# Patient Record
Sex: Male | Born: 1985 | Race: White | Hispanic: No | Marital: Married | Smoking: Current every day smoker
Health system: Southern US, Community
[De-identification: ages and names within clinical notes are randomized; demographics above are authoritative.]

## PROBLEM LIST (undated history)

## (undated) DIAGNOSIS — J40 Bronchitis, not specified as acute or chronic: Secondary | ICD-10-CM

---
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
## 2005-03-09 ENCOUNTER — Emergency Department: Payer: Self-pay | Admitting: Emergency Medicine

## 2005-08-18 ENCOUNTER — Emergency Department: Payer: Self-pay | Admitting: Emergency Medicine

## 2006-09-30 ENCOUNTER — Emergency Department: Payer: Self-pay | Admitting: Emergency Medicine

## 2006-11-27 ENCOUNTER — Emergency Department: Payer: Self-pay | Admitting: Emergency Medicine

## 2007-01-26 ENCOUNTER — Emergency Department: Payer: Self-pay | Admitting: Unknown Physician Specialty

## 2007-04-23 ENCOUNTER — Emergency Department: Payer: Self-pay | Admitting: Emergency Medicine

## 2009-07-16 ENCOUNTER — Emergency Department: Payer: Self-pay | Admitting: Emergency Medicine

## 2009-08-06 ENCOUNTER — Emergency Department: Payer: Self-pay | Admitting: Emergency Medicine

## 2009-08-07 ENCOUNTER — Emergency Department: Payer: Self-pay | Admitting: Emergency Medicine

## 2013-05-11 ENCOUNTER — Emergency Department: Payer: Self-pay | Admitting: Emergency Medicine

## 2014-01-09 ENCOUNTER — Emergency Department: Payer: Self-pay | Admitting: Emergency Medicine

## 2014-01-09 LAB — BASIC METABOLIC PANEL
Anion Gap: 8 (ref 7–16)
BUN: 6 mg/dL — AB (ref 7–18)
CALCIUM: 9 mg/dL (ref 8.5–10.1)
CREATININE: 1.07 mg/dL (ref 0.60–1.30)
Chloride: 105 mmol/L (ref 98–107)
Co2: 30 mmol/L (ref 21–32)
EGFR (African American): >60
EGFR (Non-African Amer.): >60
GLUCOSE: 120 mg/dL — AB (ref 65–99)
Osmolality: 284 (ref 275–301)
Potassium: 3.7 mmol/L (ref 3.5–5.1)
Sodium: 143 mmol/L (ref 136–145)

## 2014-01-09 LAB — CBC
HCT: 46.4 % (ref 40.0–52.0)
HGB: 16.4 g/dL (ref 13.0–18.0)
MCH: 32.3 pg (ref 26.0–34.0)
MCHC: 35.4 g/dL (ref 32.0–36.0)
MCV: 91 fL (ref 80–100)
Platelet: 216 10*3/uL (ref 150–440)
RBC: 5.09 10*6/uL (ref 4.40–5.90)
RDW: 12.8 % (ref 11.5–14.5)
WBC: 13.4 10*3/uL — AB (ref 3.8–10.6)

## 2014-01-09 LAB — TROPONIN I

## 2014-05-23 ENCOUNTER — Emergency Department
Admission: EM | Admit: 2014-05-23 | Discharge: 2014-05-24 | Disposition: A | Discharge status: Home / Self Care | Payer: Self-pay | Attending: Emergency Medicine | Admitting: Emergency Medicine

## 2014-05-23 ENCOUNTER — Encounter: Payer: Self-pay | Admitting: Emergency Medicine

## 2014-05-23 ENCOUNTER — Other Ambulatory Visit: Payer: Self-pay

## 2014-05-23 ENCOUNTER — Emergency Department: Payer: Self-pay

## 2014-05-23 DIAGNOSIS — Z720 Tobacco use: Secondary | ICD-10-CM | POA: Insufficient documentation

## 2014-05-23 DIAGNOSIS — R61 Generalized hyperhidrosis: Secondary | ICD-10-CM | POA: Insufficient documentation

## 2014-05-23 DIAGNOSIS — J209 Acute bronchitis, unspecified: Secondary | ICD-10-CM | POA: Insufficient documentation

## 2014-05-23 LAB — CBC
HCT: 43 % (ref 40.0–52.0)
HEMOGLOBIN: 14.8 g/dL (ref 13.0–18.0)
MCH: 31.1 pg (ref 26.0–34.0)
MCHC: 34.4 g/dL (ref 32.0–36.0)
MCV: 90.3 fL (ref 80.0–100.0)
Platelets: 195 10*3/uL (ref 150–440)
RBC: 4.76 MIL/uL (ref 4.40–5.90)
RDW: 13 % (ref 11.5–14.5)
WBC: 11.5 10*3/uL — ABNORMAL HIGH (ref 3.8–10.6)

## 2014-05-23 LAB — BASIC METABOLIC PANEL
Anion gap: 8 (ref 5–15)
BUN: 12 mg/dL (ref 6–20)
CALCIUM: 9.4 mg/dL (ref 8.9–10.3)
CHLORIDE: 104 mmol/L (ref 101–111)
CO2: 28 mmol/L (ref 22–32)
CREATININE: 0.99 mg/dL (ref 0.61–1.24)
GFR calc non Af Amer: >60 mL/min (ref >60)
Glucose, Bld: 96 mg/dL (ref 65–99)
Potassium: 4.1 mmol/L (ref 3.5–5.1)
SODIUM: 140 mmol/L (ref 135–145)

## 2014-05-23 LAB — FIBRIN DERIVATIVES D-DIMER (ARMC ONLY): Fibrin derivatives D-dimer (ARMC): 224 (ref 0–499)

## 2014-05-23 LAB — TROPONIN I: Troponin I: <0.03 ng/mL (ref <0.031)

## 2014-05-23 MED ORDER — IPRATROPIUM-ALBUTEROL 0.5-2.5 (3) MG/3ML IN SOLN: 3.0000 mL | Freq: Once | RESPIRATORY_TRACT | Status: DC

## 2014-05-23 MED ORDER — IPRATROPIUM-ALBUTEROL 0.5-2.5 (3) MG/3ML IN SOLN
RESPIRATORY_TRACT | Status: AC
  Filled 2014-05-23: qty 3

## 2014-05-23 NOTE — ED Notes (Signed)
Pt c/o cough for 3 days; hemoptysis; shortness of breath; generalized weakness; denies fever; hoarse voice which pt says is normal for him

## 2014-05-23 NOTE — ED Provider Notes (Signed)
[facility] Emergency Department Provider Note  ____________________________________________  Time seen: 9:45 PM  I have reviewed the triage vital signs and the nursing notes.   HISTORY  Chief Complaint Cough, hemoptysis      HPI Dillon White is a 29 y.o. male who presents with cough for 1 week that is moderate. He reports that he had an episode of hemoptysis yesterday and twice today. He notes a small amount of blood. No fevers chills. No recent travel no history of blood clots. No recent incarceration no exposure to TB. He does smoke. Nothing seems to improve his cough.     History reviewed. No pertinent past medical history.  There are no active problems to display for this patient.   History reviewed. No pertinent past surgical history.  No current outpatient prescriptions on file.  Allergies Review of patient's allergies indicates not on file.  History reviewed. No pertinent family history.  Social History History  Substance Use Topics  . Smoking status: Current Every Day Smoker -- 0.50 packs/day    Types: Cigarettes  . Smokeless tobacco: Not on file  . Alcohol Use: No    Review of Systems  Constitutional: Negative for fever. Eyes: Negative for visual changes. ENT: Negative for sore throat. Cardiovascular: Negative for chest pain. Respiratory: Positive for cough Gastrointestinal: Negative for abdominal pain, vomiting and diarrhea. Genitourinary: Negative for dysuria. Musculoskeletal: Negative for back pain. Skin: Negative for rash. Neurological: Negative for headaches, focal weakness or numbness. Psychiatric: No anxiety  10-point ROS otherwise negative.  ____________________________________________   PHYSICAL EXAM:  VITAL SIGNS: ED Triage Vitals  Enc Vitals Group     BP 05/23/14 2115 126/85 mmHg     Pulse Rate 05/23/14 2115 109     Resp 05/23/14 2115 20     Temp 05/23/14 2115 98.7 F (37.1 C)     Temp Source  05/23/14 2115 Oral     SpO2 05/23/14 2115 97 %     Weight 05/23/14 2115 225 lb (102.059 kg)     Height 05/23/14 2115 5\' 4"  (1.626 m)     Head Cir --      Peak Flow --      Pain Score 05/23/14 2116 6     Pain Loc --      Pain Edu? --      Excl. in GC? --      Constitutional: Alert and oriented. Well appearing and in no distress. Eyes: Conjunctivae are normal. PERRL. Normal extraocular movements. ENT   Head: Normocephalic and atraumatic.   Nose: No congestion/rhinnorhea.   Mouth/Throat: Mucous membranes are moist.   Neck: No stridor. Hematological/Lymphatic/Immunilogical: No cervical lymphadenopathy. Cardiovascular: Normal rate, regular rhythm. Normal and symmetric distal pulses are present in all extremities. No murmurs, rubs, or gallops. Respiratory: Normal respiratory effort without tachypnea nor retractions. Breath sounds are clear and equal bilaterally. No wheezes/rales/rhonchi. Gastrointestinal: Soft and nontender. No distention. There is no CVA tenderness. Genitourinary: deferred Musculoskeletal: Nontender with normal range of motion in all extremities. No joint effusions.  No lower extremity tenderness nor edema. Neurologic:  Normal speech and language. No gross focal neurologic deficits are appreciated. Speech is normal.  Skin:  Skin is warm, dry and intact. No rash noted. Psychiatric: Mood and affect are normal. Speech and behavior are normal. Patient exhibits appropriate insight and judgment.  ____________________________________________    LABS (pertinent positives/negatives)  Pending  ____________________________________________   EKG   Date: 05/23/2014  Rate: 102  Rhythm: Sinus tachycardia  QRS Axis: normal  Intervals: normal  ST/T Wave abnormalities: normal  Conduction Disutrbances: none  Narrative Interpretation: unremarkable      ____________________________________________    RADIOLOGY  Normal chest  x-ray  ____________________________________________   PROCEDURES  Procedure(s) performed: None  Critical Care performed: None    ____________________________________________   INITIAL IMPRESSION / ASSESSMENT AND PLAN / ED COURSE  Pertinent labs & imaging results that were available during my care of the patient were reviewed by me and considered in my medical decision making (see chart for details).  Patient well-appearing suspect bronchitis with small amount of hemoptysis however will send d-dimer to evaluate for potential blood clot.   ----------------------------------------- 11:11 PM on 05/23/2014 -----------------------------------------  Signed out care to Dr. Zenda AlpersWEBSTER she will follow-up on d-dimer ____________________________________________   FINAL CLINICAL IMPRESSION(S) / ED DIAGNOSES  Final diagnoses:  Acute bronchitis      Jene Everyobert [name], MD 05/23/14 2312

## 2014-05-23 NOTE — ED Notes (Signed)
Pt resting with no distress. This RN in room to give pt ordered nebulizer and pt refusing to ake the medication until the doctor "tells me what is going on with me".

## 2014-05-24 LAB — POCT RAPID STREP A: STREPTOCOCCUS, GROUP A SCREEN (DIRECT): NEGATIVE

## 2014-05-24 MED ORDER — AZITHROMYCIN 250 MG PO TABS: ORAL_TABLET | ORAL | Status: AC

## 2014-05-24 NOTE — ED Notes (Signed)
Throat culture obtained, neg result, and sent to lab

## 2014-05-24 NOTE — Discharge Instructions (Signed)

## 2014-05-24 NOTE — ED Provider Notes (Signed)
-----------------------------------------   12:03 AM on 05/24/2014 -----------------------------------------  Assuming care from Dr. Cyril LoosenKinner.  In short, Dillon White is a 29 y.o. male with a chief complaint of Hemoptysis; Night Sweats; Shortness of Breath; and Sore Throat .  Refer to the original H&P for additional details.  The current plan of care is to await the results of the d-dimer and disposition the patient pending those results.   The patient's d-dimer is 224. I discussed the results with the patient and he asked about his sore throat. I will perform a point of care strep test and disposition the patient pending the results.  ----------------------------------------- 12:47 AM on 05/24/2014 -----------------------------------------  The patient's strep test was negative. He will be discharged home with antibiotics and follow-up with his primary care physician.  Rebecka ApleyAllison P Hurley Sobel, MD 05/24/14 [phone]

## 2014-05-27 LAB — CULTURE, GROUP A STREP (THRC)

## 2014-10-04 ENCOUNTER — Encounter: Payer: Self-pay | Admitting: Emergency Medicine

## 2014-10-04 ENCOUNTER — Emergency Department: Payer: Self-pay

## 2014-10-04 ENCOUNTER — Emergency Department
Admission: EM | Admit: 2014-10-04 | Discharge: 2014-10-04 | Disposition: A | Discharge status: Home / Self Care | Payer: Self-pay | Attending: Emergency Medicine | Admitting: Emergency Medicine

## 2014-10-04 DIAGNOSIS — J209 Acute bronchitis, unspecified: Secondary | ICD-10-CM | POA: Insufficient documentation

## 2014-10-04 DIAGNOSIS — J069 Acute upper respiratory infection, unspecified: Secondary | ICD-10-CM | POA: Insufficient documentation

## 2014-10-04 DIAGNOSIS — J40 Bronchitis, not specified as acute or chronic: Secondary | ICD-10-CM

## 2014-10-04 DIAGNOSIS — Z720 Tobacco use: Secondary | ICD-10-CM | POA: Insufficient documentation

## 2014-10-04 HISTORY — DX: Bronchitis, not specified as acute or chronic: J40

## 2014-10-04 MED ORDER — GUAIFENESIN-CODEINE 100-10 MG/5ML PO SOLN: 10.0000 mL | Freq: Once | ORAL | Status: DC

## 2014-10-04 MED ORDER — AZITHROMYCIN 250 MG PO TABS: ORAL_TABLET | ORAL | Status: DC

## 2014-10-04 MED ORDER — ALBUTEROL SULFATE HFA 108 (90 BASE) MCG/ACT IN AERS: 2.0000 | INHALATION_SPRAY | Freq: Four times a day (QID) | RESPIRATORY_TRACT | Status: DC | PRN

## 2014-10-04 MED ORDER — PREDNISONE 10 MG PO TABS: 50.0000 mg | ORAL_TABLET | Freq: Every day | ORAL | Status: DC

## 2014-10-04 MED ORDER — ALBUTEROL SULFATE (2.5 MG/3ML) 0.083% IN NEBU
2.5000 mg | INHALATION_SOLUTION | Freq: Once | RESPIRATORY_TRACT | Status: AC
  Administered 2014-10-04: 2.5 mg via RESPIRATORY_TRACT
  Filled 2014-10-04: qty 3

## 2014-10-04 MED ORDER — GUAIFENESIN-CODEINE 100-10 MG/5ML PO SOLN: 10.0000 mL | Freq: Three times a day (TID) | ORAL | Status: DC | PRN

## 2014-10-04 MED ORDER — PREDNISONE 20 MG PO TABS
60.0000 mg | ORAL_TABLET | Freq: Once | ORAL | Status: AC
  Administered 2014-10-04: 60 mg via ORAL
  Filled 2014-10-04: qty 3

## 2014-10-04 NOTE — ED Provider Notes (Signed)
[facility] Emergency Department Provider Note  ____________________________________________  Time seen: Approximately 8:14 PM  I have reviewed the triage vital signs and the nursing notes.   HISTORY  Chief Complaint URI   HPI Dillon White is a 29 y.o. male who presents to the emergency department for evaluation of cough, wheezing, and sinus drainage for the past week. He denies a history of asthma but admits to bronchitis that occurs approximately 2 times per year. He has used his mother's albuterol inhaler with no relief. He denies fever or chills. He denies nausea, vomiting, or diarrhea.   Past Medical History  Diagnosis Date  . Bronchitis     There are no active problems to display for this patient.   History reviewed. No pertinent past surgical history.  Current Outpatient Rx  Name  Route  Sig  Dispense  Refill  . albuterol (PROVENTIL HFA;VENTOLIN HFA) 108 (90 BASE) MCG/ACT inhaler   Inhalation   Inhale 2 puffs into the lungs every 6 (six) hours as needed for wheezing or shortness of breath.   1 Inhaler   2   . azithromycin (ZITHROMAX Z-PAK) 250 MG tablet      Take 2 tablets (500 mg) on  Day 1,  followed by 1 tablet (250 mg) once daily on Days 2 through 5.   6 each   0   . guaiFENesin-codeine 100-10 MG/5ML syrup   Oral   Take 10 mLs by mouth 3 (three) times daily as needed for cough.   120 mL   0   . predniSONE (DELTASONE) 10 MG tablet   Oral   Take 5 tablets (50 mg total) by mouth daily.   25 tablet   0     Allergies Review of patient's allergies indicates no known allergies.  No family history on file.  Social History Social History  Substance Use Topics  . Smoking status: Current Every Day Smoker -- 0.50 packs/day    Types: Cigarettes  . Smokeless tobacco: None  . Alcohol Use: No    Review of Systems Constitutional: No fever/chills Eyes: No visual changes. ENT: No sore throat. Cardiovascular: Denies chest  pain. Respiratory: Denies shortness of breath. Gastrointestinal: No abdominal pain.  No nausea, no vomiting.  No diarrhea.  No constipation. Genitourinary: Negative for dysuria. Musculoskeletal: Negative for back pain. Skin: Negative for rash. Neurological: Negative for headaches, focal weakness or numbness.  10-point ROS otherwise negative.  ____________________________________________   PHYSICAL EXAM:  VITAL SIGNS: ED Triage Vitals  Enc Vitals Group     BP 10/04/14 1911 123/85 mmHg     Pulse Rate 10/04/14 1911 115     Resp 10/04/14 1911 16     Temp 10/04/14 1911 98.4 F (36.9 C)     Temp Source 10/04/14 1911 Oral     SpO2 10/04/14 1911 96 %     Weight 10/04/14 1911 215 lb (97.523 kg)     Height 10/04/14 1911  (1.651 m)     Head Cir --      Peak Flow --      Pain Score 10/04/14 1911 6     Pain Loc --      Pain Edu? --      Excl. in GC? --     Constitutional: Alert and oriented. Well appearing and in no acute distress. Eyes: Conjunctivae are normal. PERRL. EOMI. Head: Atraumatic. Nose: No congestion/rhinnorhea. Mouth/Throat: Mucous membranes are moist.  Oropharynx non-erythematous. Neck: No stridor.   Cardiovascular:  Normal rate, regular rhythm. Grossly normal heart sounds.  Good peripheral circulation. Respiratory: Normal respiratory effort.  No retractions. Expiratory wheezes throughout with rhonchi in the right lower lobe. Gastrointestinal: Soft and nontender. No distention. No abdominal bruits. No CVA tenderness. Musculoskeletal: No lower extremity tenderness nor edema.  No joint effusions. Neurologic:  Normal speech and language. No gross focal neurologic deficits are appreciated. No gait instability. Skin:  Skin is warm, dry and intact. No rash noted. Psychiatric: Mood and affect are normal. Speech and behavior are normal.  ____________________________________________   LABS (all labs ordered are listed, but only abnormal results are displayed)  Labs  Reviewed - No data to display ____________________________________________  EKG   ____________________________________________  RADIOLOGY  Chest x-ray negative for acute cardiopulmonary abnormality. ____________________________________________   PROCEDURES  Procedure(s) performed: None  Critical Care performed: No  ____________________________________________   INITIAL IMPRESSION / ASSESSMENT AND PLAN / ED COURSE  Pertinent labs & imaging results that were available during my care of the patient were reviewed by me and considered in my medical decision making (see chart for details).  Patient will be discharged home with azithromycin, prednisone, and albuterol. He was encouraged to take the medications as prescribed and until finished. He was advised to return to the emergency department for symptoms that change or worsen if he is unable schedule an appointment with primary care. ____________________________________________   FINAL CLINICAL IMPRESSION(S) / ED DIAGNOSES  Final diagnoses:  Bronchitis      Chinita Pester, FNP 10/04/14 2348  Jennye Moccasin, MD 10/05/14 [phone]

## 2014-10-04 NOTE — ED Notes (Signed)
Pt reports cough, congestion and sinus drainage x1 week.

## 2014-12-26 ENCOUNTER — Encounter: Payer: Self-pay | Admitting: Emergency Medicine

## 2014-12-26 ENCOUNTER — Emergency Department
Admission: EM | Admit: 2014-12-26 | Discharge: 2014-12-26 | Disposition: A | Discharge status: Home / Self Care | Payer: Self-pay | Attending: Emergency Medicine | Admitting: Emergency Medicine

## 2014-12-26 DIAGNOSIS — F17210 Nicotine dependence, cigarettes, uncomplicated: Secondary | ICD-10-CM | POA: Insufficient documentation

## 2014-12-26 DIAGNOSIS — Z7952 Long term (current) use of systemic steroids: Secondary | ICD-10-CM | POA: Insufficient documentation

## 2014-12-26 DIAGNOSIS — J069 Acute upper respiratory infection, unspecified: Secondary | ICD-10-CM | POA: Insufficient documentation

## 2014-12-26 DIAGNOSIS — M791 Myalgia: Secondary | ICD-10-CM | POA: Insufficient documentation

## 2014-12-26 NOTE — Discharge Instructions (Signed)
Upper Respiratory Infection, Adult Most upper respiratory infections (URIs) are a viral infection of the air passages leading to the lungs. A URI affects the nose, throat, and upper air passages. The most common type of URI is nasopharyngitis and is typically referred to as "the common cold." URIs run their course and usually go away on their own. Most of the time, a URI does not require medical attention, but sometimes a bacterial infection in the upper airways can follow a viral infection. This is called a secondary infection. Sinus and middle ear infections are common types of secondary upper respiratory infections. Bacterial pneumonia can also complicate a URI. A URI can worsen asthma and chronic obstructive pulmonary disease (COPD). Sometimes, these complications can require emergency medical care and may be life threatening.  CAUSES Almost all URIs are caused by viruses. A virus is a type of germ and can spread from one person to another.  RISKS FACTORS You may be at risk for a URI if:   You smoke.   You have chronic heart or lung disease.  You have a weakened defense (immune) system.   You are very young or very old.   You have nasal allergies or asthma.  You work in crowded or poorly ventilated areas.  You work in health care facilities or schools. SIGNS AND SYMPTOMS  Symptoms typically develop 2-3 days after you come in contact with a cold virus. Most viral URIs last 7-10 days. However, viral URIs from the influenza virus (flu virus) can last 14-18 days and are typically more severe. Symptoms may include:   Runny or stuffy (congested) nose.   Sneezing.   Cough.   Sore throat.   Headache.   Fatigue.   Fever.   Loss of appetite.   Pain in your forehead, behind your eyes, and over your cheekbones (sinus pain).  Muscle aches.  DIAGNOSIS  Your health care provider may diagnose a URI by:  Physical exam.  Tests to check that your symptoms are not due to  another condition such as:  Strep throat.  Sinusitis.  Pneumonia.  Asthma. TREATMENT  A URI goes away on its own with time. It cannot be cured with medicines, but medicines may be prescribed or recommended to relieve symptoms. Medicines may help:  Reduce your fever.  Reduce your cough.  Relieve nasal congestion. HOME CARE INSTRUCTIONS   Take medicines only as directed by your health care provider.   Gargle warm saltwater or take cough drops to comfort your throat as directed by your health care provider.  Use a warm mist humidifier or inhale steam from a shower to increase air moisture. This may make it easier to breathe.  Drink enough fluid to keep your urine clear or pale yellow.   Eat soups and other clear broths and maintain good nutrition.   Rest as needed.   Return to work when your temperature has returned to normal or as your health care provider advises. You may need to stay home longer to avoid infecting others. You can also use a face mask and careful hand washing to prevent spread of the virus.  Increase the usage of your inhaler if you have asthma.   Do not use any tobacco products, including cigarettes, chewing tobacco, or electronic cigarettes. If you need help quitting, ask your health care provider. PREVENTION  The best way to protect yourself from getting a cold is to practice good hygiene.   Avoid oral or hand contact with people with cold   symptoms.   Wash your hands often if contact occurs.  There is no clear evidence that vitamin C, vitamin E, echinacea, or exercise reduces the chance of developing a cold. However, it is always recommended to get plenty of rest, exercise, and practice good nutrition.  SEEK MEDICAL CARE IF:   You are getting worse rather than better.   Your symptoms are not controlled by medicine.   You have chills.  You have worsening shortness of breath.  You have brown or red mucus.  You have yellow or brown nasal  discharge.  You have pain in your face, especially when you bend forward.  You have a fever.  You have swollen neck glands.  You have pain while swallowing.  You have white areas in the back of your throat. SEEK IMMEDIATE MEDICAL CARE IF:   You have severe or persistent:  Headache.  Ear pain.  Sinus pain.  Chest pain.  You have chronic lung disease and any of the following:  Wheezing.  Prolonged cough.  Coughing up blood.  A change in your usual mucus.  You have a stiff neck.  You have changes in your:  Vision.  Hearing.  Thinking.  Mood. MAKE SURE YOU:   Understand these instructions.  Will watch your condition.  Will get help right away if you are not doing well or get worse.   This information is not intended to replace advice given to you by your health care provider. Make sure you discuss any questions you have with your health care provider.   Document Released: 06/27/2000 Document Revised: 05/18/2014 Document Reviewed: 04/08/2013 Elsevier Interactive Patient Education 2016 [employer]  

## 2014-12-26 NOTE — ED Provider Notes (Signed)
[facility] Emergency Department Provider Note  ____________________________________________  Time seen: On arrival  I have reviewed the triage vital signs and the nursing notes.   HISTORY  Chief Complaint Cough    HPI Dillon White is a 29 y.o. male who presents with complaints of mild cough which is productive of yellow mucus which started yesterday. He reports the rest of his family is ill as well. He also complains of some nasal congestion. Denies fevers, denies myalgias. No shortness of breath    Past Medical History  Diagnosis Date  . Bronchitis     There are no active problems to display for this patient.   History reviewed. No pertinent past surgical history.  Current Outpatient Rx  Name  Route  Sig  Dispense  Refill  . albuterol (PROVENTIL HFA;VENTOLIN HFA) 108 (90 BASE) MCG/ACT inhaler   Inhalation   Inhale 2 puffs into the lungs every 6 (six) hours as needed for wheezing or shortness of breath.   1 Inhaler   2   . azithromycin (ZITHROMAX Z-PAK) 250 MG tablet      Take 2 tablets (500 mg) on  Day 1,  followed by 1 tablet (250 mg) once daily on Days 2 through 5.   6 each   0   . guaiFENesin-codeine 100-10 MG/5ML syrup   Oral   Take 10 mLs by mouth 3 (three) times daily as needed for cough.   120 mL   0   . predniSONE (DELTASONE) 10 MG tablet   Oral   Take 5 tablets (50 mg total) by mouth daily.   25 tablet   0     Allergies Review of patient's allergies indicates no known allergies.  No family history on file.  Social History Social History  Substance Use Topics  . Smoking status: Current Every Day Smoker -- 0.50 packs/day    Types: Cigarettes  . Smokeless tobacco: None  . Alcohol Use: No    Review of Systems  Constitutional: Negative for fever. Eyes: Negative for discharge ENT: Negative for sore throat Respiratory: Positive for cough Musculoskeletal: Positive for myalgias Skin: Negative for  rash. Neurological: Negative for headaches    ____________________________________________   PHYSICAL EXAM:  VITAL SIGNS: ED Triage Vitals  Enc Vitals Group     BP 12/26/14 1023 131/85 mmHg     Pulse Rate 12/26/14 1023 88     Resp 12/26/14 1023 12     Temp 12/26/14 1023 98.2 F (36.8 C)     Temp Source 12/26/14 1023 Oral     SpO2 12/26/14 1023 98 %     Weight 12/26/14 1023 215 lb (97.523 kg)     Height 12/26/14 1023  (1.651 m)     Head Cir --      Peak Flow --      Pain Score 12/26/14 1013 1     Pain Loc --      Pain Edu? --      Excl. in GC? --      Constitutional: Alert and oriented. Well appearing and in no distress. Eyes: Conjunctivae are normal.  ENT   Head: Normocephalic and atraumatic.   Mouth/Throat: Mucous membranes are moist. Cardiovascular: Normal rate, regular rhythm.  Respiratory: Normal respiratory effort without tachypnea nor retractions. No wheezing or rales  Musculoskeletal: Nontender with normal range of motion in all extremities. Neurologic:  Normal speech and language. No gross focal neurologic deficits are appreciated. Skin:  Skin is warm, dry  and intact. No rash noted. Psychiatric: Mood and affect are normal. Patient exhibits appropriate insight and judgment.  ____________________________________________    LABS (pertinent positives/negatives)  Labs Reviewed - No data to display  ____________________________________________     ____________________________________________    RADIOLOGY I have personally reviewed any xrays that were ordered on this patient: None  ____________________________________________   PROCEDURES  Procedure(s) performed: none   ____________________________________________   INITIAL IMPRESSION / ASSESSMENT AND PLAN / ED COURSE  Pertinent labs & imaging results that were available during my care of the patient were reviewed by me and considered in my medical decision making (see chart for  details).  Patient well-appearing no distress. Symptoms are most consistent with upper respiratory infection, likely viral. Recommend supportive care. PCP follow-up as needed  ____________________________________________   FINAL CLINICAL IMPRESSION(S) / ED DIAGNOSES  Final diagnoses:  Upper respiratory infection     Jene Everyobert Midori Dado, MD 12/26/14 1343

## 2014-12-26 NOTE — ED Notes (Signed)
Patient states that he has had productive cough and chest congestion since yesterday.

## 2014-12-26 NOTE — ED Notes (Signed)
AAOx3.  Skin warm and dry.  NAD 

## 2015-09-23 ENCOUNTER — Emergency Department: Payer: Self-pay

## 2015-09-23 DIAGNOSIS — J40 Bronchitis, not specified as acute or chronic: Secondary | ICD-10-CM | POA: Insufficient documentation

## 2015-09-23 DIAGNOSIS — F17210 Nicotine dependence, cigarettes, uncomplicated: Secondary | ICD-10-CM | POA: Insufficient documentation

## 2015-09-23 MED ORDER — ALBUTEROL SULFATE (2.5 MG/3ML) 0.083% IN NEBU
5.0000 mg | INHALATION_SOLUTION | Freq: Once | RESPIRATORY_TRACT | Status: AC
  Administered 2015-09-24: 5 mg via RESPIRATORY_TRACT

## 2015-09-23 MED ORDER — ALBUTEROL SULFATE (2.5 MG/3ML) 0.083% IN NEBU
INHALATION_SOLUTION | RESPIRATORY_TRACT | Status: DC
Start: 2015-09-23 — End: 2015-09-24
  Filled 2015-09-23: qty 3

## 2015-09-23 NOTE — ED Triage Notes (Signed)
Pt reports to ED w/ c/o SOB and cough.  Pt able to ambulate to triage w/o issue.  A/Ox 4, skin tone even.  C/O CP w/ cough.  Denies taking any medications. NAD.

## 2015-09-24 ENCOUNTER — Emergency Department
Admission: EM | Admit: 2015-09-24 | Discharge: 2015-09-24 | Disposition: A | Discharge status: Home / Self Care | Payer: Self-pay | Attending: Emergency Medicine | Admitting: Emergency Medicine

## 2015-09-24 DIAGNOSIS — R062 Wheezing: Secondary | ICD-10-CM

## 2015-09-24 DIAGNOSIS — J40 Bronchitis, not specified as acute or chronic: Secondary | ICD-10-CM

## 2015-09-24 LAB — CBC WITH DIFFERENTIAL/PLATELET
BASOS PCT: 1 %
Basophils Absolute: 0.1 10*3/uL (ref 0–0.1)
Eosinophils Absolute: 0.2 10*3/uL (ref 0–0.7)
Eosinophils Relative: 1 %
HEMATOCRIT: 41.2 % (ref 40.0–52.0)
HEMOGLOBIN: 14.5 g/dL (ref 13.0–18.0)
Lymphocytes Relative: 20 %
Lymphs Abs: 2.8 10*3/uL (ref 1.0–3.6)
MCH: 31.3 pg (ref 26.0–34.0)
MCHC: 35.2 g/dL (ref 32.0–36.0)
MCV: 88.9 fL (ref 80.0–100.0)
MONO ABS: 1.1 10*3/uL — AB (ref 0.2–1.0)
MONOS PCT: 8 %
NEUTROS ABS: 10 10*3/uL — AB (ref 1.4–6.5)
Neutrophils Relative %: 70 %
Platelets: 158 10*3/uL (ref 150–440)
RBC: 4.63 MIL/uL (ref 4.40–5.90)
RDW: 12.8 % (ref 11.5–14.5)
WBC: 14.2 10*3/uL — ABNORMAL HIGH (ref 3.8–10.6)

## 2015-09-24 LAB — BASIC METABOLIC PANEL
Anion gap: 7 (ref 5–15)
BUN: 7 mg/dL (ref 6–20)
CALCIUM: 8.8 mg/dL — AB (ref 8.9–10.3)
CHLORIDE: 100 mmol/L — AB (ref 101–111)
CO2: 28 mmol/L (ref 22–32)
CREATININE: 0.88 mg/dL (ref 0.61–1.24)
GFR calc Af Amer: >60 mL/min (ref >60)
GFR calc non Af Amer: >60 mL/min (ref >60)
GLUCOSE: 111 mg/dL — AB (ref 65–99)
Potassium: 3.9 mmol/L (ref 3.5–5.1)
Sodium: 135 mmol/L (ref 135–145)

## 2015-09-24 MED ORDER — HYDROCOD POLST-CPM POLST ER 10-8 MG/5ML PO SUER: 5.0000 mL | Freq: Two times a day (BID) | ORAL | 0 refills | Status: DC

## 2015-09-24 MED ORDER — METHYLPREDNISOLONE SODIUM SUCC 125 MG IJ SOLR
125.0000 mg | Freq: Once | INTRAMUSCULAR | Status: AC
  Administered 2015-09-24: 125 mg via INTRAVENOUS
  Filled 2015-09-24: qty 2

## 2015-09-24 MED ORDER — SODIUM CHLORIDE 0.9 % IV BOLUS (SEPSIS)
1000.0000 mL | Freq: Once | INTRAVENOUS | Status: AC
  Administered 2015-09-24: 1000 mL via INTRAVENOUS
  Filled 2015-09-24: qty 1000

## 2015-09-24 MED ORDER — KETOROLAC TROMETHAMINE 30 MG/ML IJ SOLN
10.0000 mg | Freq: Once | INTRAMUSCULAR | Status: AC
  Administered 2015-09-24: 9.9 mg via INTRAVENOUS
  Filled 2015-09-24: qty 1

## 2015-09-24 MED ORDER — HYDROCOD POLST-CPM POLST ER 10-8 MG/5ML PO SUER
5.0000 mL | Freq: Once | ORAL | Status: AC
  Administered 2015-09-24: 5 mL via ORAL
  Filled 2015-09-24: qty 5

## 2015-09-24 MED ORDER — ALBUTEROL SULFATE HFA 108 (90 BASE) MCG/ACT IN AERS: 2.0000 | INHALATION_SPRAY | RESPIRATORY_TRACT | 0 refills | Status: DC | PRN

## 2015-09-24 MED ORDER — PREDNISONE 20 MG PO TABS: ORAL_TABLET | ORAL | 0 refills | Status: DC

## 2015-09-24 MED ORDER — IPRATROPIUM-ALBUTEROL 0.5-2.5 (3) MG/3ML IN SOLN
3.0000 mL | Freq: Once | RESPIRATORY_TRACT | Status: AC
Start: 2015-09-24 — End: 2015-09-24
  Administered 2015-09-24: 3 mL via RESPIRATORY_TRACT
  Filled 2015-09-24: qty 3

## 2015-09-24 MED ORDER — IPRATROPIUM-ALBUTEROL 0.5-2.5 (3) MG/3ML IN SOLN
3.0000 mL | Freq: Once | RESPIRATORY_TRACT | Status: AC
  Administered 2015-09-24: 3 mL via RESPIRATORY_TRACT
  Filled 2015-09-24: qty 3

## 2015-09-24 NOTE — ED Provider Notes (Signed)
[facility] Emergency Department Provider Note   ____________________________________________   First MD Initiated Contact with Patient 09/24/15 [phone]     (approximate)  I have reviewed the triage vital signs and the nursing notes.   HISTORY  Chief Complaint Shortness of Breath    HPI Dillon White is a 30 y.o. male who presents to the ED from home with a chief complaint of cough and shortness of breath. Reports onset of symptoms 3-4 days ago. Complains of cough productive of green sputum, shortness of breath, nasal congestion and generalized malaise. Denies associated headache, neck pain, vision changes, abdominal pain, nausea, vomiting, diarrhea. Denies recent travel or trauma. Nothing makes his symptoms better or worse.   Past Medical History:  Diagnosis Date  . Bronchitis     There are no active problems to display for this patient.   History reviewed. No pertinent surgical history.  Prior to Admission medications   Medication Sig Start Date End Date Taking? Authorizing Provider  albuterol (PROVENTIL HFA;VENTOLIN HFA) 108 (90 Base) MCG/ACT inhaler Inhale 2 puffs into the lungs every 4 (four) hours as needed for wheezing or shortness of breath. 09/24/15   Irean HongJade J Astraea Gaughran, MD  azithromycin (ZITHROMAX Z-PAK) 250 MG tablet Take 2 tablets (500 mg) on  Day 1,  followed by 1 tablet (250 mg) once daily on Days 2 through 5. Patient not taking: Reported on 09/24/2015 10/04/14   Chinita Pesterari B Triplett, FNP  chlorpheniramine-HYDROcodone (TUSSIONEX PENNKINETIC ER) 10-8 MG/5ML SUER Take 5 mLs by mouth 2 (two) times daily. 09/24/15   Irean HongJade J Bobbye Reinitz, MD  guaiFENesin-codeine 100-10 MG/5ML syrup Take 10 mLs by mouth 3 (three) times daily as needed for cough. Patient not taking: Reported on 09/24/2015 10/04/14   Chinita Pesterari B Triplett, FNP  predniSONE (DELTASONE) 20 MG tablet 3 tablets daily 4 days 09/24/15   Irean HongJade J Tyreesha Maharaj, MD    Allergies Review of patient's allergies indicates no known  allergies.  No family history on file.  Social History Social History  Substance Use Topics  . Smoking status: Current Every Day Smoker    Packs/day: 0.50    Types: Cigarettes  . Smokeless tobacco: Never Used  . Alcohol use No    Review of Systems  Constitutional: No fever/chills. Eyes: No visual changes. ENT: Positive for nasal congestion. No sore throat. Cardiovascular: Denies chest pain. Respiratory: Positive for productive cough and shortness of breath. Gastrointestinal: No abdominal pain.  No nausea, no vomiting.  No diarrhea.  No constipation. Genitourinary: Negative for dysuria. Musculoskeletal: Negative for back pain. Skin: Negative for rash. Neurological: Negative for headaches, focal weakness or numbness.  10-point ROS otherwise negative.  ____________________________________________   PHYSICAL EXAM:  VITAL SIGNS: ED Triage Vitals  Enc Vitals Group     BP 09/23/15 2322 137/86     Pulse Rate 09/23/15 2322 (!) 120     Resp 09/23/15 2322 20     Temp 09/23/15 2322 99 F (37.2 C)     Temp Source 09/23/15 2322 Oral     SpO2 09/23/15 2322 94 %     Weight 09/23/15 2322 220 lb (99.8 kg)     Height 09/23/15 2322 5\' 5"  (1.651 m)     Head Circumference --      Peak Flow --      Pain Score 09/23/15 2320 6     Pain Loc --      Pain Edu? --      Excl. in GC? --  Constitutional: Alert and oriented. Well appearing and in mild acute distress. Eyes: Conjunctivae are normal. PERRL. EOMI. Head: Atraumatic. Nose: Congestion/rhinnorhea. Mouth/Throat: Mucous membranes are moist.  Oropharynx non-erythematous. Neck: No stridor.  Supple neck without meningismus. Hematological/Lymphatic/Immunilogical: No cervical lymphadenopathy. Cardiovascular: Tachycardic rate, regular rhythm. Grossly normal heart sounds.  Good peripheral circulation. Respiratory: Normal respiratory effort.  No retractions. Lungs with diffuse wheezing. Gastrointestinal: Soft and nontender. No  distention. No abdominal bruits. No CVA tenderness. Musculoskeletal: No lower extremity tenderness nor edema.  No joint effusions. Neurologic:  Normal speech and language. No gross focal neurologic deficits are appreciated. No gait instability. Skin:  Skin is warm, dry and intact. No rash noted. Psychiatric: Mood and affect are normal. Speech and behavior are normal.  ____________________________________________   LABS (all labs ordered are listed, but only abnormal results are displayed)  Labs Reviewed  CBC WITH DIFFERENTIAL/PLATELET - Abnormal; Notable for the following:       Result Value   WBC 14.2 (*)    Neutro Abs 10.0 (*)    Monocytes Absolute 1.1 (*)    All other components within normal limits  BASIC METABOLIC PANEL - Abnormal; Notable for the following:    Chloride 100 (*)    Glucose, Bld 111 (*)    Calcium 8.8 (*)    All other components within normal limits   ____________________________________________  EKG  ED ECG REPORT I, Kareena Arrambide J, the attending physician, personally viewed and interpreted this ECG.   Date: 09/24/2015  EKG Time: 2322  Rate: 119  Rhythm: sinus tachycardia  Axis: Normal  Intervals:none  ST&T Change: Nonspecific  ____________________________________________  RADIOLOGY  Chest 2 view (view by me, interpreted per Dr. Phill Myron): 1. Mild scattered peribronchial thickening, which may reflect  sequela of acute bronchiolitis and/or reactive airways disease. No  focal infiltrates to suggest pneumonia.  2. Mild left basilar subsegmental atelectasis.     ____________________________________________   PROCEDURES  Procedure(s) performed: None  Procedures  Critical Care performed: No  ____________________________________________   INITIAL IMPRESSION / ASSESSMENT AND PLAN / ED COURSE  Pertinent labs & imaging results that were available during my care of the patient were reviewed by me and considered in my medical decision making  (see chart for details).  30 year old male who presents with cold type symptoms with diffuse wheezing on exam. At rest, his heart rate is 98. Patient sat up for auscultation and heart rate increased to 120. Will initiate IV fluid resuscitation, Solu-Medrol, nebulizer treatments and reassess.  Clinical Course  Comment By Time  Wheezing and aeration improved, now mild wheeze remains in lung bases. Will administer second DuoNeb and reassess. Irean Hong, MD 09/09 0421  Wheezing much improved. Discussed with patient and spouse; will discharge home with prednisone, Tussionex, albuterol inhaler. Encourage patient to follow-up with his PCP this week. Strict return precautions given. Both verbalize understanding and agree with plan of care. Irean Hong, MD 09/09 0533     ____________________________________________   FINAL CLINICAL IMPRESSION(S) / ED DIAGNOSES  Final diagnoses:  Bronchitis  Wheezing      NEW MEDICATIONS STARTED DURING THIS VISIT:  New Prescriptions   ALBUTEROL (PROVENTIL HFA;VENTOLIN HFA) 108 (90 BASE) MCG/ACT INHALER    Inhale 2 puffs into the lungs every 4 (four) hours as needed for wheezing or shortness of breath.   CHLORPHENIRAMINE-HYDROCODONE (TUSSIONEX PENNKINETIC ER) 10-8 MG/5ML SUER    Take 5 mLs by mouth 2 (two) times daily.   PREDNISONE (DELTASONE) 20 MG TABLET    3  tablets daily 4 days     Note:  This document was prepared using Dragon voice recognition software and may include unintentional dictation errors.    Irean Hong, MD 09/24/15 [phone]

## 2015-09-24 NOTE — Discharge Instructions (Signed)
1. Take prednisone 60 mg daily 4 days. 2. You may take cough medicine as needed (Tussionex). 3. Use albuterol inhaler 2 puffs every 4 hours as needed for wheezing. 4. Return to the ER for worsening symptoms, persistent vomiting, difficulty breathing or other concerns.

## 2015-09-29 ENCOUNTER — Emergency Department: Payer: Self-pay

## 2015-09-29 ENCOUNTER — Emergency Department
Admission: EM | Admit: 2015-09-29 | Discharge: 2015-09-30 | Disposition: A | Discharge status: Home / Self Care | Payer: Self-pay | Attending: Emergency Medicine | Admitting: Emergency Medicine

## 2015-09-29 ENCOUNTER — Encounter: Payer: Self-pay | Admitting: Emergency Medicine

## 2015-09-29 DIAGNOSIS — F17210 Nicotine dependence, cigarettes, uncomplicated: Secondary | ICD-10-CM | POA: Insufficient documentation

## 2015-09-29 DIAGNOSIS — J189 Pneumonia, unspecified organism: Secondary | ICD-10-CM | POA: Insufficient documentation

## 2015-09-29 MED ORDER — LEVOFLOXACIN IN D5W 750 MG/150ML IV SOLN
750.0000 mg | Freq: Once | INTRAVENOUS | Status: AC
Start: 2015-09-29 — End: 2015-09-30
  Administered 2015-09-29: 750 mg via INTRAVENOUS
  Filled 2015-09-29: qty 150

## 2015-09-29 MED ORDER — ALBUTEROL SULFATE (2.5 MG/3ML) 0.083% IN NEBU
INHALATION_SOLUTION | RESPIRATORY_TRACT | Status: AC
  Filled 2015-09-29: qty 6

## 2015-09-29 MED ORDER — SODIUM CHLORIDE 0.9 % IV BOLUS (SEPSIS)
1000.0000 mL | Freq: Once | INTRAVENOUS | Status: AC
  Administered 2015-09-29: 1000 mL via INTRAVENOUS

## 2015-09-29 MED ORDER — ALBUTEROL SULFATE (2.5 MG/3ML) 0.083% IN NEBU
5.0000 mg | INHALATION_SOLUTION | Freq: Once | RESPIRATORY_TRACT | Status: AC
  Administered 2015-09-29: 5 mg via RESPIRATORY_TRACT

## 2015-09-29 MED ORDER — LEVOFLOXACIN 750 MG PO TABS: 750.0000 mg | ORAL_TABLET | Freq: Every day | ORAL | 0 refills | Status: AC

## 2015-09-29 NOTE — ED Triage Notes (Signed)
Pt presents to ED with c/o cough and SHOB, pt states was seen here approx 1 week ago and dx with pneumonia and bronchitis. [facility] Pt states he coughing up clear phlegm. Pt is alert and oriented at this time, able to answer questions with some difficulty at this time.

## 2015-09-29 NOTE — ED Provider Notes (Signed)
[facility] Emergency Department Provider Note    ____________________________________________   I have reviewed the triage vital signs and the nursing notes.   HISTORY  Chief Complaint Cough and Shortness of Breath   History limited by: Not Limited   HPI Dillon White is a 30 y.o. male who presents to the emergency department today with continued shortness of breath and cough. Patient was seen in the emergency department roughly 5 days ago and diagnosed with bronchitis. He states he has been taking medications but does not feel any significant relief. He continues to cough. He has had some chest pain which he thinks is primarily related to the coughing. He has had fevers.   Past Medical History:  Diagnosis Date  . Bronchitis     There are no active problems to display for this patient.   History reviewed. No pertinent surgical history.  Prior to Admission medications   Medication Sig Start Date End Date Taking? Authorizing Provider  albuterol (PROVENTIL HFA;VENTOLIN HFA) 108 (90 Base) MCG/ACT inhaler Inhale 2 puffs into the lungs every 4 (four) hours as needed for wheezing or shortness of breath. 09/24/15   Irean Hong, MD  azithromycin (ZITHROMAX Z-PAK) 250 MG tablet Take 2 tablets (500 mg) on  Day 1,  followed by 1 tablet (250 mg) once daily on Days 2 through 5. Patient not taking: Reported on 09/24/2015 10/04/14   Chinita Pester, FNP  chlorpheniramine-HYDROcodone (TUSSIONEX PENNKINETIC ER) 10-8 MG/5ML SUER Take 5 mLs by mouth 2 (two) times daily. 09/24/15   Irean Hong, MD  guaiFENesin-codeine 100-10 MG/5ML syrup Take 10 mLs by mouth 3 (three) times daily as needed for cough. Patient not taking: Reported on 09/24/2015 10/04/14   Chinita Pester, FNP  predniSONE (DELTASONE) 20 MG tablet 3 tablets daily 4 days 09/24/15   Irean Hong, MD    Allergies Review of patient's allergies indicates no known allergies.  History reviewed. No pertinent family  history.  Social History Social History  Substance Use Topics  . Smoking status: Current Every Day Smoker    Packs/day: 0.50    Types: Cigarettes  . Smokeless tobacco: Never Used  . Alcohol use No    Review of Systems  Constitutional: Negative for fever. Cardiovascular: Positive for chest pain. Respiratory: Positive for shortness of breath and cough. Gastrointestinal: Negative for abdominal pain, vomiting and diarrhea. Genitourinary: Negative for dysuria. Musculoskeletal: Negative for back pain. Skin: Negative for rash. Neurological: Negative for headaches, focal weakness or numbness.   10-point ROS otherwise negative.  ____________________________________________   PHYSICAL EXAM:  VITAL SIGNS: ED Triage Vitals  Enc Vitals Group     BP 09/29/15 1852 140/78     Pulse Rate 09/29/15 1852 91     Resp 09/29/15 1852 18     Temp 09/29/15 1852 98.6 F (37 C)     Temp Source 09/29/15 1852 Oral     SpO2 09/29/15 1852 99 %     Weight 09/29/15 1853 220 lb (99.8 kg)     Height 09/29/15 1853 5\' 5"  (1.651 m)     Head Circumference --      Peak Flow --      Pain Score 09/29/15 1858 0   Constitutional: Alert and oriented.  Eyes: Conjunctivae are normal. Normal extraocular movements. ENT   Head: Normocephalic and atraumatic.   Nose: No congestion/rhinnorhea.   Mouth/Throat: Mucous membranes are moist.   Neck: No stridor. Hematological/Lymphatic/Immunilogical: No cervical lymphadenopathy. Cardiovascular: Normal rate, regular  rhythm.  No murmurs, rubs, or gallops. Respiratory: Normal respiratory effort without tachypnea nor retractions. Breath sounds are clear and equal bilaterally. No wheezes/rales/rhonchi. Occasional cough appreciated. Gastrointestinal: Soft and nontender. No distention.  Genitourinary: Deferred Musculoskeletal: Normal range of motion in all extremities. No lower extremity edema. Neurologic:  Normal speech and language. No gross focal neurologic  deficits are appreciated.  Skin:  Skin is warm, dry and intact. No rash noted. Psychiatric: Mood and affect are normal. Speech and behavior are normal. Patient exhibits appropriate insight and judgment.  ____________________________________________    LABS (pertinent positives/negatives)  Labs Reviewed - No data to display   ____________________________________________   EKG   I, Phineas SemenGraydon Gerrod Maule, attending physician, personally viewed and interpreted this EKG  EKG Time: 1852 Rate: 86 Rhythm: normal sinus rhythm Axis: normal Intervals: qtc 416 QRS: narrow ST changes: no st elevation Impression: normal ekg  ____________________________________________    RADIOLOGY  CXR  IMPRESSION:  Development of streaky right perihilar and left upper lung zone  opacities from prior, concerning for pneumonia. Bronchial thickening  is again seen.     ____________________________________________   PROCEDURES  Procedures  ____________________________________________   INITIAL IMPRESSION / ASSESSMENT AND PLAN / ED COURSE  Pertinent labs & imaging results that were available during my care of the patient were reviewed by me and considered in my medical decision making (see chart for details).  Patient presents to the emergency department today because of concerns for continued cough and shortness of breath. Chest x-ray today is concerning for a pneumonia. Will plan on giving patient does IV antibiotics here. Will discharge home with antibiotics. ____________________________________________   FINAL CLINICAL IMPRESSION(S) / ED DIAGNOSES  Final diagnoses:  Community acquired pneumonia     Note: This dictation was prepared with Office managerDragon dictation. Any transcriptional errors that result from this process are unintentional    Phineas SemenGraydon Fanchon Papania, MD 09/29/15 [phone]

## 2015-09-29 NOTE — Discharge Instructions (Signed)
Please seek medical attention for any high fevers, chest pain, shortness of breath, change in behavior, persistent vomiting, bloody stool or any other new or concerning symptoms.  

## 2015-09-30 NOTE — ED Notes (Signed)
Discharge instructions reviewed with patient. Questions fielded by this RN. Patient verbalizes understanding of instructions. Patient discharged home in stable condition per Goodman MD . No acute distress noted at time of discharge.   

## 2016-07-01 ENCOUNTER — Emergency Department: Payer: Self-pay

## 2016-07-01 ENCOUNTER — Inpatient Hospital Stay
Admission: EM | Admit: 2016-07-01 | Discharge: 2016-07-03 | DRG: 097 | Disposition: A | Discharge status: Home / Self Care | Payer: Self-pay | Attending: Internal Medicine | Admitting: Internal Medicine

## 2016-07-01 ENCOUNTER — Encounter: Payer: Self-pay | Admitting: *Deleted

## 2016-07-01 DIAGNOSIS — M5410 Radiculopathy, site unspecified: Secondary | ICD-10-CM | POA: Diagnosis present

## 2016-07-01 DIAGNOSIS — A86 Unspecified viral encephalitis: Principal | ICD-10-CM | POA: Diagnosis present

## 2016-07-01 DIAGNOSIS — G9349 Other encephalopathy: Secondary | ICD-10-CM | POA: Diagnosis present

## 2016-07-01 DIAGNOSIS — R410 Disorientation, unspecified: Secondary | ICD-10-CM | POA: Diagnosis present

## 2016-07-01 DIAGNOSIS — R509 Fever, unspecified: Secondary | ICD-10-CM

## 2016-07-01 DIAGNOSIS — F17210 Nicotine dependence, cigarettes, uncomplicated: Secondary | ICD-10-CM | POA: Diagnosis present

## 2016-07-01 DIAGNOSIS — R4182 Altered mental status, unspecified: Secondary | ICD-10-CM

## 2016-07-01 DIAGNOSIS — R51 Headache: Secondary | ICD-10-CM

## 2016-07-01 DIAGNOSIS — M545 Low back pain: Secondary | ICD-10-CM | POA: Diagnosis present

## 2016-07-01 DIAGNOSIS — H538 Other visual disturbances: Secondary | ICD-10-CM | POA: Diagnosis present

## 2016-07-01 DIAGNOSIS — M795 Residual foreign body in soft tissue: Secondary | ICD-10-CM

## 2016-07-01 DIAGNOSIS — R519 Headache, unspecified: Secondary | ICD-10-CM

## 2016-07-01 DIAGNOSIS — M542 Cervicalgia: Secondary | ICD-10-CM

## 2016-07-01 LAB — CSF CELL COUNT WITH DIFFERENTIAL
EOS CSF: 0 %
Eosinophils, CSF: 0 %
LYMPHS CSF: 68 %
LYMPHS CSF: 70 %
MONOCYTE-MACROPHAGE-SPINAL FLUID: 0 %
MONOCYTE-MACROPHAGE-SPINAL FLUID: 6 %
OTHER CELLS CSF: 0
OTHER CELLS CSF: 0
RBC COUNT CSF: 1369 /mm3 — AB (ref 0–3)
RBC COUNT CSF: 661 /mm3 — AB (ref 0–3)
SEGMENTED NEUTROPHILS-CSF: 26 %
Segmented Neutrophils-CSF: 30 %
Tube #: 1
Tube #: 3
WBC, CSF: 23 /mm3 (ref 0–5)
WBC, CSF: 54 /mm3 (ref 0–5)

## 2016-07-01 LAB — URINALYSIS, COMPLETE (UACMP) WITH MICROSCOPIC
BILIRUBIN URINE: NEGATIVE
Bacteria, UA: NONE SEEN
GLUCOSE, UA: NEGATIVE mg/dL
HGB URINE DIPSTICK: NEGATIVE
KETONES UR: NEGATIVE mg/dL
LEUKOCYTES UA: NEGATIVE
NITRITE: NEGATIVE
PH: 6 (ref 5.0–8.0)
PROTEIN: NEGATIVE mg/dL
Specific Gravity, Urine: 1.014 (ref 1.005–1.030)
Squamous Epithelial / LPF: NONE SEEN

## 2016-07-01 LAB — COMPREHENSIVE METABOLIC PANEL
ALBUMIN: 4.2 g/dL (ref 3.5–5.0)
ALK PHOS: 90 U/L (ref 38–126)
ALT: 54 U/L (ref 17–63)
ANION GAP: 7 (ref 5–15)
AST: 52 U/L — ABNORMAL HIGH (ref 15–41)
BUN: 7 mg/dL (ref 6–20)
CALCIUM: 8.8 mg/dL — AB (ref 8.9–10.3)
CHLORIDE: 99 mmol/L — AB (ref 101–111)
CO2: 27 mmol/L (ref 22–32)
Creatinine, Ser: 1.09 mg/dL (ref 0.61–1.24)
GFR calc non Af Amer: >60 mL/min (ref >60)
GLUCOSE: 78 mg/dL (ref 65–99)
Potassium: 3.2 mmol/L — ABNORMAL LOW (ref 3.5–5.1)
SODIUM: 133 mmol/L — AB (ref 135–145)
Total Bilirubin: 0.7 mg/dL (ref 0.3–1.2)
Total Protein: 7.7 g/dL (ref 6.5–8.1)

## 2016-07-01 LAB — CBC
HCT: 46.4 % (ref 40.0–52.0)
HEMOGLOBIN: 16.2 g/dL (ref 13.0–18.0)
MCH: 30.9 pg (ref 26.0–34.0)
MCHC: 34.8 g/dL (ref 32.0–36.0)
MCV: 88.8 fL (ref 80.0–100.0)
Platelets: 133 10*3/uL — ABNORMAL LOW (ref 150–440)
RBC: 5.23 MIL/uL (ref 4.40–5.90)
RDW: 12.7 % (ref 11.5–14.5)
WBC: 6.3 10*3/uL (ref 3.8–10.6)

## 2016-07-01 LAB — PROTIME-INR
INR: 1.05
Prothrombin Time: 13.7 seconds (ref 11.4–15.2)

## 2016-07-01 LAB — LACTIC ACID, PLASMA
LACTIC ACID, VENOUS: 1.7 mmol/L (ref 0.5–1.9)
Lactic Acid, Venous: 0.7 mmol/L (ref 0.5–1.9)

## 2016-07-01 LAB — PROTEIN AND GLUCOSE, CSF
GLUCOSE CSF: 71 mg/dL — AB (ref 40–70)
TOTAL PROTEIN, CSF: 52 mg/dL — AB (ref 15–45)

## 2016-07-01 MED ORDER — ONDANSETRON HCL 4 MG/2ML IJ SOLN: 4.0000 mg | Freq: Four times a day (QID) | INTRAMUSCULAR | Status: DC | PRN

## 2016-07-01 MED ORDER — SODIUM CHLORIDE 0.9 % IV BOLUS (SEPSIS)
1000.0000 mL | Freq: Once | INTRAVENOUS | Status: AC
  Administered 2016-07-01: 1000 mL via INTRAVENOUS

## 2016-07-01 MED ORDER — LIDOCAINE HCL (PF) 1 % IJ SOLN
INTRAMUSCULAR | Status: AC
  Administered 2016-07-01: 10 mL
  Filled 2016-07-01: qty 10

## 2016-07-01 MED ORDER — DEXTROSE 5 % IV SOLN
10.0000 mg/kg | Freq: Three times a day (TID) | INTRAVENOUS | Status: DC
  Administered 2016-07-01 – 2016-07-03 (×5): 1000 mg via INTRAVENOUS
  Filled 2016-07-01 (×7): qty 20

## 2016-07-01 MED ORDER — NICOTINE 14 MG/24HR TD PT24
14.0000 mg | MEDICATED_PATCH | Freq: Once | TRANSDERMAL | Status: AC
  Administered 2016-07-01: 14 mg via TRANSDERMAL
  Filled 2016-07-01: qty 1

## 2016-07-01 MED ORDER — ALBUTEROL SULFATE (2.5 MG/3ML) 0.083% IN NEBU: 2.5000 mg | INHALATION_SOLUTION | RESPIRATORY_TRACT | Status: DC | PRN

## 2016-07-01 MED ORDER — LIDOCAINE HCL (PF) 1 % IJ SOLN
10.0000 mL | Freq: Once | INTRAMUSCULAR | Status: AC
  Administered 2016-07-01: 10 mL

## 2016-07-01 MED ORDER — ONDANSETRON HCL 4 MG PO TABS: 4.0000 mg | ORAL_TABLET | Freq: Four times a day (QID) | ORAL | Status: DC | PRN

## 2016-07-01 MED ORDER — ONDANSETRON HCL 4 MG/2ML IJ SOLN
4.0000 mg | Freq: Once | INTRAMUSCULAR | Status: AC
  Administered 2016-07-01: 4 mg via INTRAVENOUS

## 2016-07-01 MED ORDER — HYDROCODONE-ACETAMINOPHEN 5-325 MG PO TABS
1.0000 | ORAL_TABLET | ORAL | Status: DC | PRN
  Administered 2016-07-01 – 2016-07-03 (×5): 1 via ORAL
  Filled 2016-07-01 (×5): qty 1

## 2016-07-01 MED ORDER — DEXAMETHASONE SODIUM PHOSPHATE 10 MG/ML IJ SOLN
10.0000 mg | Freq: Once | INTRAMUSCULAR | Status: AC
  Administered 2016-07-01: 10 mg via INTRAVENOUS
  Filled 2016-07-01: qty 1

## 2016-07-01 MED ORDER — DEXTROSE 5 % IV SOLN
2.0000 g | Freq: Two times a day (BID) | INTRAVENOUS | Status: DC
  Administered 2016-07-02: 2 g via INTRAVENOUS
  Filled 2016-07-01 (×2): qty 2

## 2016-07-01 MED ORDER — SODIUM CHLORIDE 0.9 % IV SOLN
2.0000 g | Freq: Once | INTRAVENOUS | Status: AC
  Administered 2016-07-01: 2 g via INTRAVENOUS
  Filled 2016-07-01 (×2): qty 2000

## 2016-07-01 MED ORDER — POTASSIUM CHLORIDE IN NACL 20-0.9 MEQ/L-% IV SOLN
INTRAVENOUS | Status: DC
  Administered 2016-07-01 – 2016-07-02 (×2): via INTRAVENOUS
  Filled 2016-07-01 (×6): qty 1000

## 2016-07-01 MED ORDER — ONDANSETRON HCL 4 MG/2ML IJ SOLN
INTRAMUSCULAR | Status: AC
  Administered 2016-07-01: 4 mg via INTRAVENOUS
  Filled 2016-07-01: qty 2

## 2016-07-01 MED ORDER — SENNOSIDES-DOCUSATE SODIUM 8.6-50 MG PO TABS: 1.0000 | ORAL_TABLET | Freq: Every evening | ORAL | Status: DC | PRN

## 2016-07-01 MED ORDER — VANCOMYCIN HCL IN DEXTROSE 1-5 GM/200ML-% IV SOLN
1000.0000 mg | Freq: Once | INTRAVENOUS | Status: AC
  Administered 2016-07-01: 1000 mg via INTRAVENOUS
  Filled 2016-07-01: qty 200

## 2016-07-01 MED ORDER — ACYCLOVIR SODIUM 50 MG/ML IV SOLN
10.0000 mg/kg | Freq: Once | INTRAVENOUS | Status: DC
  Filled 2016-07-01: qty 20

## 2016-07-01 MED ORDER — VANCOMYCIN HCL IN DEXTROSE 1-5 GM/200ML-% IV SOLN
1000.0000 mg | Freq: Three times a day (TID) | INTRAVENOUS | Status: DC
  Administered 2016-07-02 (×2): 1000 mg via INTRAVENOUS
  Filled 2016-07-01 (×4): qty 200

## 2016-07-01 MED ORDER — ACETAMINOPHEN 325 MG PO TABS: 650.0000 mg | ORAL_TABLET | Freq: Four times a day (QID) | ORAL | Status: DC | PRN

## 2016-07-01 MED ORDER — AMPICILLIN SODIUM 2 G IJ SOLR
2.0000 g | Freq: Four times a day (QID) | INTRAMUSCULAR | Status: DC
  Administered 2016-07-01 – 2016-07-02 (×2): 2 g via INTRAVENOUS
  Filled 2016-07-01 (×3): qty 2000

## 2016-07-01 MED ORDER — ACETAMINOPHEN 650 MG RE SUPP: 650.0000 mg | Freq: Four times a day (QID) | RECTAL | Status: DC | PRN

## 2016-07-01 MED ORDER — CEFTRIAXONE SODIUM 2 G IJ SOLR
2.0000 g | Freq: Once | INTRAMUSCULAR | Status: AC
  Administered 2016-07-01: 2 g via INTRAVENOUS
  Filled 2016-07-01: qty 2

## 2016-07-01 NOTE — ED Notes (Signed)
This RN, Dr. Karl BalesSchaevtiz, Vanessa Barbaraonnor, Extern, Shanda BumpsJessica, PA-Student at bedside for LP.

## 2016-07-01 NOTE — ED Notes (Signed)
Date and time results received: 07/01/16 1922   Test: WBC Critical Value: 54 in tube 1 CSF, 23 in tube 3 CSF  Name of Provider Notified: Dr. Pershing ProudSchaevitz  Orders Received? Or Actions Taken?: Acknowledged
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
## 2016-07-01 NOTE — ED Notes (Signed)
Pt sat up in bed and began vomiting. MD notified, received orders for 4mg  Zofran IV, administered per MD order.
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
## 2016-07-01 NOTE — Progress Notes (Signed)
Pharmacy Antibiotic Note  Dillon White is a 31 y.o. male admitted on 07/01/2016 with possible meningitis.  Pharmacy has been consulted for ceftriaxone, ampicillin and vancomycin dosing. Patient received vancomycin 1g IV x 1, ampicillin 2gm IV, and ceftriaxone 2gm IV in ED x 1 dose. Patient also received acyclovir 1000mg  IV x 1 dose.   Plan: DW: 77kg     Ke: 0.093   Vd: 54  T1/2: 7.5  1: Will order vancomycin 1g IV every 8 hours. Calculated trough at Css is 17. Trough ordered prior to 5th dose.   2. Will order Ampicillin 2g IV every 6 hours.   3. Will order ceftriaxone 2g IV every 12 hours.   Height: 5\' 5"  (165.1 cm) Weight: 220 lb (99.8 kg) IBW/kg (Calculated) : 61.5  Temp (24hrs), Avg:100 F (37.8 C), Min:100 F (37.8 C), Max:100 F (37.8 C)   Recent Labs Lab 07/01/16 1619  WBC 6.3  CREATININE 1.09  LATICACIDVEN 1.7    Estimated Creatinine Clearance: 107.6 mL/min (by C-G formula based on SCr of 1.09 mg/dL).    No Known Allergies  Antimicrobials this admission: 6/17 vancomyin >>  6/17 ceftriaxone >>  6/17 ampicillin>>   Dose adjustments this admission:   Microbiology results: 6/17 BCx: sent 6/17 WGN:FAOZHYQCx:pending 6/17 CSF Cx: pending   Thank you for allowing pharmacy to be a part of this patient's care.  Gardner CandleSheema M Artis Buechele, PharmD, BCPS Clinical Pharmacist 07/01/2016 7:18 PM

## 2016-07-01 NOTE — ED Notes (Signed)
Pt returned from CT °

## 2016-07-01 NOTE — ED Notes (Signed)
Pt taken to CT. Per MD, hold abx until after LP. This RN and MD received verbal consent over the phone from patient's wife to perform LP due to patient's altered mental status and confusion.

## 2016-07-01 NOTE — ED Provider Notes (Signed)
[facility] Emergency Department Provider Note  ____________________________________________   First MD Initiated Contact with Patient 07/01/16 1545     (approximate)  I have reviewed the triage vital signs and the nursing notes.   HISTORY  Chief Complaint Altered Mental Status   HPI Dillon White is a 31 y.o. male with a history of bronchitis was presenting to the emergency department with 1 day of worsening fever as well as confusion. He is here with his mother who says that he has been acting unlike himself, not recognizing his wife and other relatives. The patient also was not knowing the year. Reporting a severe frontal headache radiating to the back of his neck with pain with range of motion of his neck as well.   Past Medical History:  Diagnosis Date  . Bronchitis     There are no active problems to display for this patient.   History reviewed. No pertinent surgical history.  Prior to Admission medications   Medication Sig Start Date End Date Taking? Authorizing Provider  albuterol (PROVENTIL HFA;VENTOLIN HFA) 108 (90 Base) MCG/ACT inhaler Inhale 2 puffs into the lungs every 4 (four) hours as needed for wheezing or shortness of breath. 09/24/15   Irean Hong, MD  azithromycin (ZITHROMAX Z-PAK) 250 MG tablet Take 2 tablets (500 mg) on  Day 1,  followed by 1 tablet (250 mg) once daily on Days 2 through 5. Patient not taking: Reported on 09/24/2015 10/04/14   Kem Boroughs B, FNP  chlorpheniramine-HYDROcodone (TUSSIONEX PENNKINETIC ER) 10-8 MG/5ML SUER Take 5 mLs by mouth 2 (two) times daily. 09/24/15   Irean Hong, MD  guaiFENesin-codeine 100-10 MG/5ML syrup Take 10 mLs by mouth 3 (three) times daily as needed for cough. Patient not taking: Reported on 09/24/2015 10/04/14   Kem Boroughs B, FNP  predniSONE (DELTASONE) 20 MG tablet 3 tablets daily 4 days 09/24/15   Irean Hong, MD    Allergies Patient has no known allergies.  History reviewed. No  pertinent family history.  Social History Social History  Substance Use Topics  . Smoking status: Current Every Day Smoker    Packs/day: 0.50    Types: Cigarettes  . Smokeless tobacco: Never Used  . Alcohol use No    Review of Systems  Constitutional: fever Eyes: No visual changes. ENT: No sore throat. Cardiovascular: Denies chest pain. Respiratory: mild chronic cough Gastrointestinal: No abdominal pain.  No nausea, no vomiting.  No diarrhea.  No constipation. Genitourinary: Negative for dysuria. Musculoskeletal: Negative for back pain. Skin: Negative for rash. Neurological: Negative for focal weakness or numbness.   ____________________________________________   PHYSICAL EXAM:  VITAL SIGNS: ED Triage Vitals  Enc Vitals Group     BP 07/01/16 1533 (!) 161/96     Pulse Rate 07/01/16 1533 (!) 125     Resp 07/01/16 1533 (!) 22     Temp 07/01/16 1533 100 F (37.8 C)     Temp Source 07/01/16 1533 Oral     SpO2 07/01/16 1533 97 %     Weight 07/01/16 1534 220 lb (99.8 kg)     Height 07/01/16 1534 5\' 5"  (1.651 m)     Head Circumference --      Peak Flow --      Pain Score 07/01/16 1531 4     Pain Loc --      Pain Edu? --      Excl. in GC? --     Constitutional: Alert and oriented  To self and location but persistently says it is 2012 even after being told it is 2018. in no acute distress. Eyes: Conjunctivae are normal.  Head: Atraumatic. Nose: No congestion/rhinnorhea. Mouth/Throat: Mucous membranes are moist.  Neck: No stridor.  Able to range neck without any apparent restriction but says it hurts greatly diffusely to the posterior neck.  Cardiovascular:  Tachycardic, regular rhythm. Grossly normal heart sounds.  Respiratory: Normal respiratory effort.  No retractions. Lungs CTAB. Gastrointestinal: Soft and nontender. No distention.  Musculoskeletal: No lower extremity tenderness nor edema.  No joint effusions. Neurologic:  Normal speech and language. No gross  focal neurologic deficits are appreciated. Skin:  Skin is warm, dry and intact. No rash noted. Psychiatric: Agitated  ____________________________________________   LABS (all labs ordered are listed, but only abnormal results are displayed)  Labs Reviewed  CULTURE, BLOOD (ROUTINE X 2)  CULTURE, BLOOD (ROUTINE X 2)  URINE CULTURE  COMPREHENSIVE METABOLIC PANEL  CBC  LACTIC ACID, PLASMA  LACTIC ACID, PLASMA  PROTIME-INR  URINALYSIS, COMPLETE (UACMP) WITH MICROSCOPIC   ____________________________________________  EKG   ____________________________________________  RADIOLOGY  No acute finding on the CT the patient's head over the chest x-ray. ____________________________________________   PROCEDURES  Procedure(s) performed:  LUMBAR PUNCTURE  Date/Time: 07/01/2016 at 4:44 PM Performed by: Arelia Longest  Consent: Verbal consent obtained. Written consent obtained. Risks and benefits: risks, benefits and alternatives were discussed Consent given by: Wife over the phone. Patient is disoriented. Witnessed by nursing. Patient understanding: patient states understanding of the procedure being performed  Patient consent: the patient's understanding of the procedure matches consent given  Procedure consent: procedure consent matches procedure scheduled  Relevant documents: relevant documents present and verified  Test results: test results available and properly labeled Site marked: the operative site was marked Imaging studies: imaging studies available  Required items: required blood products, implants, devices, and special equipment available  Patient identity confirmed: verbally with patient and arm band  Time out: Immediately prior to procedure a "time out" was called to verify the correct patient, procedure, equipment, support staff and site/side marked as required.  Indications: Altered mental status. Fever.  Anesthesia: local infiltration Local anesthetic:  lidocaine 1% without epinephrine Anesthetic total: 7 ml Patient sedated: no Analgesia: lidocaine Preparation: Patient was prepped and draped in the usual sterile fashion. Lumbar space: L3-L4 interspace Patient's position: left lateral decubitus Needle gauge: 22 Needle length: 3.5 in Number of attempts: 1 Opening pressure:  Fluid appearance: clear, slightly cloudy.  Initially with a small amount of blood Tubes of fluid: 4 Total volume: 4 ml Post-procedure: site cleaned and adhesive bandage applied Patient tolerance: One point during the procedure during insertion of the needle the patient complained of pain radiating down his left leg. I withdrew the needle slightly, however, the patient continued to complain of tingling and numbness on the lateral asked to the left leg that radiated down past his knee. However, his sensation is intact to light touch. He is able to range the bilateral lower extremities but with slightly more effort needed to range the left. I discussed this with Dr. Esmeralda Arthur of the admitting service and also with the family status post the procedure. This will require further monitoring. The patient was consented fully in addition to his family, including his wife, who consented the procedure. We discussed the risks of infection as well as nerve damage and bleeding.    Procedures  Critical Care performed:  CRITICAL CARE Performed by: Arelia Longest  Total critical care time: 35 minutes  Critical care time was exclusive of separately billable procedures and treating other patients.  Critical care was necessary to treat or prevent imminent or life-threatening deterioration.  Critical care was time spent personally by me on the following activities: development of treatment plan with patient and/or surrogate as well as nursing, discussions with consultants, evaluation of patient's response to treatment, examination of patient, obtaining history from patient or  surrogate, ordering and performing treatments and interventions, ordering and review of laboratory studies, ordering and review of radiographic studies, pulse oximetry and re-evaluation of patient's condition.   ____________________________________________   INITIAL IMPRESSION / ASSESSMENT AND PLAN / ED COURSE  Pertinent labs & imaging results that were available during my care of the patient were reviewed by me and considered in my medical decision making (see chart for details).  ----------------------------------------- 7:15 PM on 07/01/2016 -----------------------------------------  At this point, the patient has been treated empirically for meningitis well as viral encephalitis. The patient as well as the family are understanding and will comply with the plan for admission. Signed out to Dr. Esmeralda Arthurrosby.      ____________________________________________   FINAL CLINICAL IMPRESSION(S) / ED DIAGNOSES  Fever. Altered mental status.    NEW MEDICATIONS STARTED DURING THIS VISIT:  New Prescriptions   No medications on file     Note:  This document was prepared using Dragon voice recognition software and may include unintentional dictation errors.     Myrna BlazerSchaevitz, David Matthew, MD 07/01/16 [id number]

## 2016-07-01 NOTE — ED Notes (Signed)
Pt taken to CT.

## 2016-07-02 ENCOUNTER — Inpatient Hospital Stay: Payer: Self-pay

## 2016-07-02 DIAGNOSIS — R410 Disorientation, unspecified: Secondary | ICD-10-CM

## 2016-07-02 DIAGNOSIS — R51 Headache: Secondary | ICD-10-CM

## 2016-07-02 DIAGNOSIS — M542 Cervicalgia: Secondary | ICD-10-CM

## 2016-07-02 LAB — PATHOLOGIST SMEAR REVIEW

## 2016-07-02 MED ORDER — DOXYCYCLINE HYCLATE 100 MG IV SOLR
100.0000 mg | Freq: Two times a day (BID) | INTRAVENOUS | Status: DC
  Administered 2016-07-02 (×2): 100 mg via INTRAVENOUS
  Filled 2016-07-02 (×4): qty 100

## 2016-07-02 MED ORDER — GADOBENATE DIMEGLUMINE 529 MG/ML IV SOLN
20.0000 mL | Freq: Once | INTRAVENOUS | Status: AC | PRN
  Administered 2016-07-02: 20 mL via INTRAVENOUS

## 2016-07-02 MED ORDER — SODIUM CHLORIDE 0.9 % IV SOLN
2.0000 g | INTRAVENOUS | Status: DC
  Filled 2016-07-02 (×5): qty 2000

## 2016-07-02 MED ORDER — KETOROLAC TROMETHAMINE 30 MG/ML IJ SOLN
30.0000 mg | Freq: Once | INTRAMUSCULAR | Status: AC
Start: 2016-07-02 — End: 2016-07-02
  Administered 2016-07-02: 30 mg via INTRAVENOUS
  Filled 2016-07-02: qty 1

## 2016-07-02 MED ORDER — NICOTINE 14 MG/24HR TD PT24
14.0000 mg | MEDICATED_PATCH | Freq: Once | TRANSDERMAL | Status: DC
  Administered 2016-07-02: 14 mg via TRANSDERMAL
  Filled 2016-07-02: qty 1

## 2016-07-02 NOTE — Progress Notes (Signed)
Antibiotics delayed due to MRI scan.  Fluids need to be held during the procedure.

## 2016-07-02 NOTE — Progress Notes (Signed)
ID  Attempted to see patient but he was on the phone, did not acknowledge my presence and would not get off phone, Reviewed chart- likely viral meningitis. Studies pending.  WIll see tomorrow if he is still inpatient
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
## 2016-07-02 NOTE — Care Management (Addendum)
CM has made two attempts to speak with patient due to lack of payor source. He was not in his room.  Sx concerning for meningitis- cultures pending.  ID to consult and neurology has consulted and recommended brain MRI

## 2016-07-02 NOTE — Progress Notes (Signed)
[facility] - [place] [facility]   PATIENT NAME: Dillon BanasJason White    MR#:  [id number]  DATE OF BIRTH:  [birth year]  SUBJECTIVE:  CHIEF COMPLAINT:   Chief Complaint  Patient presents with  . Altered Mental Status   Complains of headache, neck pain which are improved from admission. Started having back pain and left lower extremity tingling after LP. No weakness. Mentions he had 20-30 takes land on his arm at work as he deals with [employer].  REVIEW OF SYSTEMS:    Review of Systems  Constitutional: Positive for malaise/fatigue. Negative for chills and fever.  HENT: Negative for sore throat.   Eyes: Negative for blurred vision, double vision and pain.  Respiratory: Negative for cough, hemoptysis, shortness of breath and wheezing.   Cardiovascular: Negative for chest pain, palpitations, orthopnea and leg swelling.  Gastrointestinal: Negative for abdominal pain, constipation, diarrhea, heartburn, nausea and vomiting.  Genitourinary: Negative for dysuria and hematuria.  Musculoskeletal: Positive for back pain. Negative for joint pain.  Skin: Negative for rash.  Neurological: Positive for tingling, sensory change, weakness and headaches. Negative for speech change and focal weakness.  Endo/Heme/Allergies: Does not bruise/bleed easily.  Psychiatric/Behavioral: Negative for depression. The patient is not nervous/anxious.     DRUG ALLERGIES:  No Known Allergies  VITALS:  Blood pressure 109/67, pulse 80, temperature 97.9 F (36.6 C), temperature source Oral, resp. rate 16, height 5\' 5"  (1.651 m), weight 99.8 kg (220 lb), SpO2 95 %.  PHYSICAL EXAMINATION:   Physical Exam  GENERAL:  31 y.o.-year-old patient lying in the bed with no acute distress.  EYES: Pupils equal, round, reactive to light and accommodation. No scleral icterus. Extraocular muscles intact.  HEENT: Head atraumatic, normocephalic. Oropharynx and nasopharynx clear.  NECK:  Supple, no jugular  venous distention. No thyroid enlargement, no tenderness.  LUNGS: Normal breath sounds bilaterally, no wheezing, rales, rhonchi. No use of accessory muscles of respiration.  CARDIOVASCULAR: S1, S2 normal. No murmurs, rubs, or gallops.  ABDOMEN: Soft, nontender, nondistended. Bowel sounds present. No organomegaly or mass.  EXTREMITIES: No cyanosis, clubbing or edema b/l.    NEUROLOGIC: Cranial nerves II through XII are intact. No focal Motor or sensory deficits b/l.   PSYCHIATRIC: The patient is alert and oriented x 3.  SKIN: No obvious rash, lesion, or ulcer.   LABORATORY PANEL:   CBC  Recent Labs Lab 07/01/16 1619  WBC 6.3  HGB 16.2  HCT 46.4  PLT 133*   ------------------------------------------------------------------------------------------------------------------ Chemistries   Recent Labs Lab 07/01/16 1619  NA 133*  K 3.2*  CL 99*  CO2 27  GLUCOSE 78  BUN 7  CREATININE 1.09  CALCIUM 8.8*  AST 52*  ALT 54  ALKPHOS 90  BILITOT 0.7   ------------------------------------------------------------------------------------------------------------------  Cardiac Enzymes No results for input(s): TROPONINI in the last 168 hours. ------------------------------------------------------------------------------------------------------------------  RADIOLOGY:  Ct Head Wo Contrast  Result Date: 07/01/2016 CLINICAL DATA:  Initial evaluation for acute altered mental status. EXAM: CT HEAD WITHOUT CONTRAST TECHNIQUE: Contiguous axial images were obtained from the base of the skull through the vertex without intravenous contrast. COMPARISON:  None. FINDINGS: Brain: Cerebral volume within normal limits for patient age. No evidence for acute intracranial hemorrhage. No findings to suggest acute large vessel territory infarct. No mass lesion, midline shift, or mass effect. Ventricles are normal in size without evidence for hydrocephalus. No extra-axial fluid collection identified.  Vascular: No hyperdense vessel identified. Skull: Scalp soft tissues demonstrate no acute abnormality.Calvarium intact. Sinuses/Orbits: Globes and  orbital soft tissues are within normal limits. Visualized paranasal sinuses are clear. No mastoid effusion. IMPRESSION: Normal head CT.  No acute intracranial process identified. Electronically Signed   By: Rise Mu M.D.   On: 07/01/2016 17:21   Dg Chest Port 1 View  Result Date: 07/01/2016 CLINICAL DATA:  Altered mental status. EXAM: PORTABLE CHEST 1 VIEW COMPARISON:  09/29/2015 FINDINGS: Cardiomediastinal silhouette is normal. Mediastinal contours appear intact. There is no evidence of focal airspace consolidation, pleural effusion or pneumothorax. Osseous structures are without acute abnormality. Soft tissues are grossly normal. IMPRESSION: No active disease. Electronically Signed   By: Ted Mcalpine M.D.   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 We'll consult infectious disease. Discussed with Dr. Sampson Goon and Dr. Thad Ranger.  * Acute encephalopathy due to meningoencephalitis has resolved  * Tobacco use. Counseled to quit.  All the records are reviewed and case discussed with Care Management/Social Workerr. Management plans discussed with the patient, family and they are in agreement.  CODE STATUS: FULL CODE  DVT Prophylaxis: SCDs  TOTAL TIME TAKING CARE OF THIS PATIENT: 25 minutes.   POSSIBLE D/C IN 1-2 DAYS, DEPENDING ON CLINICAL CONDITION.  Milagros Loll R M.D on 07/02/2016 at 12:19 PM  Between 7am to 6pm - Pager - [phone]  After 6pm go to [email or web address] - password [email or web address]  [facility]  Office  [phone]  CC: Primary care physician; Patient, No Pcp Per  Note: This dictation was prepared with Dragon dictation along with smaller phrase technology. Any transcriptional errors that result from this process are  unintentional.

## 2016-07-02 NOTE — Progress Notes (Signed)
Report called to Tunji, RN on 1C. VSS, no complaints of pain. No tele monitoring.

## 2016-07-02 NOTE — Consult Note (Signed)
Reason for Consult:Headache, fever Referring Physician: Sudini  CC: Headache, fever  HPI: Dillon White is an 31 y.o. male who on Saturday developed fevers, confusion and complained of a headache. Fever became as high as 102.  Later he complained of neck pain. Yesterday he developed blurred vision. He reports no nausea or vomiting. He states he is occasionally lightheaded and that he has some ringing in his ears.  Today reports that he feels somewhat better.  Still has some right sided neck pain.  Now also reports some back pai at he site of LP and shooting intermittent pains down his left leg.  Past Medical History:  Diagnosis Date  . Bronchitis     History reviewed. No pertinent surgical history.  History reviewed. No pertinent family history.  Social History:  reports that he has been smoking Cigarettes.  He has been smoking about 0.50 packs per day. He has never used smokeless tobacco. He reports that he does not drink alcohol or use drugs.  No Known Allergies  Medications:  I have reviewed the patient's current medications. Prior to Admission:  Prescriptions Prior to Admission  Medication Sig Dispense Refill Last Dose  . albuterol (PROVENTIL HFA;VENTOLIN HFA) 108 (90 Base) MCG/ACT inhaler Inhale 2 puffs into the lungs every 4 (four) hours as needed for wheezing or shortness of breath. 1 Inhaler 0 prn at prn  . nicotine (NICODERM CQ - DOSED IN MG/24 HOURS) 21 mg/24hr patch Place 21 mg onto the skin daily as needed.   prn at prn   Scheduled: . nicotine  14 mg Transdermal Once  . nicotine  14 mg Transdermal Once    ROS: History obtained from the patient  General ROS: as noted in HPI Psychological ROS: as noted in HPI Ophthalmic ROS: as noted in HPI ENT ROS: as noted in HPI Allergy and Immunology ROS: negative for - hives or itchy/watery eyes Hematological and Lymphatic ROS: negative for - bleeding problems, bruising or swollen lymph nodes Endocrine ROS: negative for -  galactorrhea, hair pattern changes, polydipsia/polyuria or temperature intolerance Respiratory ROS: cough Cardiovascular ROS: negative for - chest pain, dyspnea on exertion, edema or irregular heartbeat Gastrointestinal ROS: negative for - abdominal pain, diarrhea, hematemesis, nausea/vomiting or stool incontinence Genito-Urinary ROS: negative for - dysuria, hematuria, incontinence or urinary frequency/urgency Musculoskeletal ROS: negative for - joint swelling or muscular weakness Neurological ROS: as noted in HPI Dermatological ROS: negative for rash and skin lesion changes  Physical Examination: Blood pressure 109/67, pulse 80, temperature 97.9 F (36.6 C), temperature source Oral, resp. rate 16, height 5\' 5"  (1.651 m), weight 99.8 kg (220 lb), SpO2 95 %.  Gen: NAD HEENT-  Normocephalic, no lesions, without obvious abnormality.  Normal external eye and conjunctiva.  Normal TM's bilaterally.  Normal auditory canals and external ears. Normal external nose, mucus membranes and septum.  Normal pharynx. Cardiovascular- S1, S2 normal, pulses palpable throughout   Lungs- chest clear, no wheezing, rales, normal symmetric air entry Abdomen- soft, non-tender; bowel sounds normal; no masses,  no organomegaly Extremities- no edema Lymph-no adenopathy palpable Musculoskeletal-no joint tenderness, deformity or swelling Skin-warm and dry, no hyperpigmentation, vitiligo, or suspicious lesions  Neurological Examination   Mental Status: Alert, oriented, thought content appropriate.  Speech fluent without evidence of aphasia.  Able to follow 3 step commands without difficulty. Cranial Nerves: II: Discs flat bilaterally; Visual fields grossly normal, pupils equal, round, reactive to light and accommodation III,IV, VI: ptosis not present, extra-ocular motions intact bilaterally V,VII: smile symmetric, facial  light touch sensation normal bilaterally VIII: hearing normal bilaterally IX,X: gag reflex  present XI: bilateral shoulder shrug XII: midline tongue extension Motor: Right : Upper extremity   5/5    Left:     Upper extremity   5/5  Lower extremity   5/5     Lower extremity   5/5 Tone and bulk:normal tone throughout; no atrophy noted Sensory: Pinprick and light touch intact throughout, bilaterally Deep Tendon Reflexes: 2+ and symmetric throughout Plantars: Right: downgoing   Left: downgoing Cerebellar: normal finger-to-nose, normal rapid alternating movements and normal heel-to-shin test Gait: normal gait and station    Laboratory Studies:   Basic Metabolic Panel:  Recent Labs Lab 07/01/16 1619  NA 133*  K 3.2*  CL 99*  CO2 27  GLUCOSE 78  BUN 7  CREATININE 1.09  CALCIUM 8.8*    Liver Function Tests:  Recent Labs Lab 07/01/16 1619  AST 52*  ALT 54  ALKPHOS 90  BILITOT 0.7  PROT 7.7  ALBUMIN 4.2   No results for input(s): LIPASE, AMYLASE in the last 168 hours. No results for input(s): AMMONIA in the last 168 hours.  CBC:  Recent Labs Lab 07/01/16 1619  WBC 6.3  HGB 16.2  HCT 46.4  MCV 88.8  PLT 133*    Cardiac Enzymes: No results for input(s): CKTOTAL, CKMB, CKMBINDEX, TROPONINI in the last 168 hours.  BNP: Invalid input(s): POCBNP  CBG: No results for input(s): GLUCAP in the last 168 hours.  Microbiology: Results for orders placed or performed during the hospital encounter of 07/01/16  Culture, blood (Routine x 2)     Status: None (Preliminary result)   Collection Time: 07/01/16  4:19 PM  Result Value Ref Range Status   Specimen Description BLOOD LEFT AC  Final   Special Requests BOTTLES DRAWN AEROBIC AND ANAEROBIC BCHV  Final   Culture NO GROWTH < 24 HOURS  Final   Report Status PENDING  Incomplete  Culture, blood (Routine x 2)     Status: None (Preliminary result)   Collection Time: 07/01/16  4:20 PM  Result Value Ref Range Status   Specimen Description BLOOD RIGHT AC  Final   Special Requests BOTTLES DRAWN AEROBIC AND  ANAEROBIC BCLV  Final   Culture NO GROWTH < 24 HOURS  Final   Report Status PENDING  Incomplete  CSF culture     Status: None (Preliminary result)   Collection Time: 07/01/16  5:58 PM  Result Value Ref Range Status   Specimen Description CSF LUMBAR PUNCTURE  Final   Special Requests NONE  Final   Gram Stain   Final    NO ORGANISMS SEEN FEW WBC SEEN MANY RED BLOOD CELLS PRESENT    Culture   Final    NO GROWTH 1 DAY Performed at [facility], [address], [place], [place] [address]    Report Status PENDING  Incomplete    Coagulation Studies:  Recent Labs  07/01/16 1619  LABPROT 13.7  INR 1.05    Urinalysis:  Recent Labs Lab 07/01/16 1730  COLORURINE YELLOW*  LABSPEC 1.014  PHURINE 6.0  GLUCOSEU NEGATIVE  HGBUR NEGATIVE  BILIRUBINUR NEGATIVE  KETONESUR NEGATIVE  PROTEINUR NEGATIVE  NITRITE NEGATIVE  LEUKOCYTESUR NEGATIVE    Lipid Panel:  No results found for: CHOL, TRIG, HDL, CHOLHDL, VLDL, LDLCALC  HgbA1C: No results found for: HGBA1C  Urine Drug Screen:  No results found for: LABOPIA, COCAINSCRNUR, LABBENZ, AMPHETMU, THCU, LABBARB  Alcohol Level: No results for  input(s): ETH in the last 168 hours.   Imaging: Ct Head Wo Contrast  Result Date: 07/01/2016 CLINICAL DATA:  Initial evaluation for acute altered mental status. EXAM: CT HEAD WITHOUT CONTRAST TECHNIQUE: Contiguous axial images were obtained from the base of the skull through the vertex without intravenous contrast. COMPARISON:  None. FINDINGS: Brain: Cerebral volume within normal limits for patient age. No evidence for acute intracranial hemorrhage. No findings to suggest acute large vessel territory infarct. No mass lesion, midline shift, or mass effect. Ventricles are normal in size without evidence for hydrocephalus. No extra-axial fluid collection identified. Vascular: No hyperdense vessel identified. Skull: Scalp soft tissues demonstrate no acute abnormality.Calvarium intact.  Sinuses/Orbits: Globes and orbital soft tissues are within normal limits. Visualized paranasal sinuses are clear. No mastoid effusion. IMPRESSION: Normal head CT.  No acute intracranial process identified. Electronically Signed   By: Rise Mu M.D.   On: 07/01/2016 17:21   Dg Chest Port 1 View  Result Date: 07/01/2016 CLINICAL DATA:  Altered mental status. EXAM: PORTABLE CHEST 1 VIEW COMPARISON:  09/29/2015 FINDINGS: Cardiomediastinal silhouette is normal. Mediastinal contours appear intact. There is no evidence of focal airspace consolidation, pleural effusion or pneumothorax. Osseous structures are without acute abnormality. Soft tissues are grossly normal. IMPRESSION: No active disease. Electronically Signed   By: Ted Mcalpine M.D.   On: 07/01/2016 17:17     Assessment/Plan: 31 year old male presenting with headache, fevers and confusion.  Concern is for meningitis.  LP performed and shows elevated white blood cells (70% lymphs), glucose 71 and protein 52.  Gram stain shows no organisms.  Suspect a viral meningitis and not bacterial.  ID involved.  Head CT reviewed and shows no acute changes.   Patient also now with back pain some radiation to the left leg.  Patient did have radicular pain radiation in the left leg during LP as well.  Suspect these symptoms should gradually resolve.    Recommendations: 1.  Would consider discontinuation of Vanc and Rocephin. 2.  Awaiting HSV titer results 3.  MRI of the brain   Thana Farr, MD Neurology [phone] 07/02/2016, 12:53 PM

## 2016-07-03 LAB — HERPES SIMPLEX VIRUS(HSV) DNA BY PCR
HSV 1 DNA: NEGATIVE
HSV 2 DNA: NEGATIVE

## 2016-07-03 LAB — URINE CULTURE: Culture: NO GROWTH

## 2016-07-03 LAB — HIV ANTIBODY (ROUTINE TESTING W REFLEX): HIV Screen 4th Generation wRfx: NONREACTIVE

## 2016-07-03 LAB — RPR: RPR Ser Ql: NONREACTIVE

## 2016-07-03 MED ORDER — DOXYCYCLINE HYCLATE 100 MG PO TABS
100.0000 mg | ORAL_TABLET | Freq: Two times a day (BID) | ORAL | Status: DC
  Administered 2016-07-03: 10:00:00 100 mg via ORAL
  Filled 2016-07-03: qty 1

## 2016-07-03 MED ORDER — DOXYCYCLINE HYCLATE 100 MG PO TABS: 100.0000 mg | ORAL_TABLET | Freq: Two times a day (BID) | ORAL | 0 refills | Status: DC

## 2016-07-03 NOTE — Progress Notes (Signed)
Pt d/ced home.  Seen by Dr. Sampson GoonFitzgerald this am.  Sd to continue on doxycycline 2x/day for 5 days.  Pt has been afebrile.  C/o of lower back pain and left leg pain - states "it feels like needle is still in my back" from spinal tap.  Pt has no trouble ambulating and is up to shower.  WBC elevated in spinal fluid.  IVs removed by nurse tech.  Will review d/c instructions and point out [facility] cessation [facility] at [facility].  Also reviewed f/u appts.  Pt will transport home with wife.  Work note also provided.
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
## 2016-07-03 NOTE — Progress Notes (Signed)
[facility]    Cherylann BanasJason Kopec was admitted to the Hospital on 07/01/2016 and Discharged  07/03/2016 and should be excused from work/school for 5  days starting 07/01/2016 , may return to work/school without any restrictions.  Call Enedina FinnerSona Selby Slovacek MD, [facility]  [phone] with questions.  Sira Adsit M.D on 07/03/2016,at 9:02 AM

## 2016-07-03 NOTE — Discharge Instructions (Signed)
Urgent care as needed

## 2016-07-04 LAB — ENTEROVIRUS PCR: Enterovirus PCR: NEGATIVE

## 2016-07-05 ENCOUNTER — Encounter: Payer: Self-pay | Admitting: Infectious Diseases

## 2016-07-05 LAB — CSF CULTURE W GRAM STAIN
Culture: NO GROWTH
Gram Stain: NONE SEEN

## 2016-07-05 NOTE — Progress Notes (Signed)
ID FU of labs  I reveiwed this pts labs and all negative from his CSF. Tried to call him but no answer and no voicemail set up.   [phone] (M)

## 2016-07-06 LAB — CULTURE, BLOOD (ROUTINE X 2)
CULTURE: NO GROWTH
CULTURE: NO GROWTH

## 2016-07-20 ENCOUNTER — Emergency Department: Payer: Self-pay

## 2016-07-20 ENCOUNTER — Emergency Department
Admission: EM | Admit: 2016-07-20 | Discharge: 2016-07-20 | Disposition: A | Discharge status: Home / Self Care | Payer: Self-pay | Attending: Emergency Medicine | Admitting: Emergency Medicine

## 2016-07-20 DIAGNOSIS — M79605 Pain in left leg: Secondary | ICD-10-CM | POA: Insufficient documentation

## 2016-07-20 DIAGNOSIS — Z5321 Procedure and treatment not carried out due to patient leaving prior to being seen by health care provider: Secondary | ICD-10-CM | POA: Insufficient documentation
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
## 2017-12-17 ENCOUNTER — Other Ambulatory Visit: Payer: Self-pay

## 2017-12-17 DIAGNOSIS — K047 Periapical abscess without sinus: Secondary | ICD-10-CM | POA: Insufficient documentation

## 2017-12-17 DIAGNOSIS — F17210 Nicotine dependence, cigarettes, uncomplicated: Secondary | ICD-10-CM | POA: Insufficient documentation

## 2017-12-17 DIAGNOSIS — K029 Dental caries, unspecified: Secondary | ICD-10-CM | POA: Insufficient documentation

## 2017-12-17 NOTE — ED Triage Notes (Signed)
Pt in with co toothache for a week

## 2017-12-18 ENCOUNTER — Emergency Department
Admission: EM | Admit: 2017-12-18 | Discharge: 2017-12-18 | Disposition: A | Discharge status: Home / Self Care | Payer: Self-pay | Attending: Emergency Medicine | Admitting: Emergency Medicine

## 2017-12-18 DIAGNOSIS — K047 Periapical abscess without sinus: Secondary | ICD-10-CM

## 2017-12-18 DIAGNOSIS — K029 Dental caries, unspecified: Secondary | ICD-10-CM

## 2017-12-18 MED ORDER — AMOXICILLIN-POT CLAVULANATE 875-125 MG PO TABS: 1.0000 | ORAL_TABLET | Freq: Two times a day (BID) | ORAL | 0 refills | Status: AC

## 2017-12-18 MED ORDER — KETOROLAC TROMETHAMINE 60 MG/2ML IM SOLN
60.0000 mg | Freq: Once | INTRAMUSCULAR | Status: AC
Start: 2017-12-18 — End: 2017-12-18
  Administered 2017-12-18: 60 mg via INTRAMUSCULAR
  Filled 2017-12-18: qty 2

## 2017-12-18 MED ORDER — LIDOCAINE VISCOUS HCL 2 % MT SOLN
15.0000 mL | Freq: Once | OROMUCOSAL | Status: AC
  Administered 2017-12-18: 15 mL via OROMUCOSAL
  Filled 2017-12-18: qty 15

## 2017-12-18 MED ORDER — KETOROLAC TROMETHAMINE 10 MG PO TABS: 10.0000 mg | ORAL_TABLET | Freq: Four times a day (QID) | ORAL | 0 refills | Status: DC | PRN

## 2017-12-18 MED ORDER — AMOXICILLIN-POT CLAVULANATE 875-125 MG PO TABS
1.0000 | ORAL_TABLET | Freq: Once | ORAL | Status: AC
  Administered 2017-12-18: 1 via ORAL
  Filled 2017-12-18: qty 1

## 2017-12-18 NOTE — ED Provider Notes (Signed)
[facility] Emergency Department Provider Note    First MD Initiated Contact with Patient 12/18/17 [phone]     (approximate)  I have reviewed the triage vital signs and the nursing notes.   HISTORY  Chief Complaint Dental Pain   HPI Dillon White is a 32 y.o. male presents to the emergency department with a one-week history of left upper dental pain is 1 week.  Patient denies any associated fever difficulty breathing or swallowing.  Patient denies any nausea or vomiting.   Past Medical History:  Diagnosis Date  . Bronchitis     Patient Active Problem List   Diagnosis Date Noted  . Confusion 07/01/2016  . Fever 07/01/2016  . Neck pain 07/01/2016  . Confusion and disorientation 07/01/2016    No past surgical history on file.  Prior to Admission medications   Medication Sig Start Date End Date Taking? Authorizing Provider  albuterol (PROVENTIL HFA;VENTOLIN HFA) 108 (90 Base) MCG/ACT inhaler Inhale 2 puffs into the lungs every 4 (four) hours as needed for wheezing or shortness of breath. 09/24/15   Irean Hong, MD  amoxicillin-clavulanate (AUGMENTIN) 875-125 MG tablet Take 1 tablet by mouth 2 (two) times daily for 10 days. 12/18/17 12/28/17  Darci Current, MD  doxycycline (VIBRA-TABS) 100 MG tablet Take 1 tablet (100 mg total) by mouth every 12 (twelve) hours. 07/03/16   Enedina Finner, MD  ketorolac (TORADOL) 10 MG tablet Take 1 tablet (10 mg total) by mouth every 6 (six) hours as needed. 12/18/17   Darci Current, MD  nicotine (NICODERM CQ - DOSED IN MG/24 HOURS) 21 mg/24hr patch Place 21 mg onto the skin daily as needed.    [provider]    Allergies No known drug allergies No family history on file.  Social History Social History   Tobacco Use  . Smoking status: Current Every Day Smoker    Packs/day: 0.50    Types: Cigarettes  . Smokeless tobacco: Never Used  Substance Use Topics  . Alcohol use: No  . Drug use: No     Review of Systems Constitutional: No fever/chills Eyes: No visual changes. ENT: No sore throat.  Positive for dental pain Cardiovascular: Denies chest pain. Respiratory: Denies shortness of breath. Gastrointestinal: No abdominal pain.  No nausea, no vomiting.  No diarrhea.  No constipation. Genitourinary: Negative for dysuria. Musculoskeletal: Negative for neck pain.  Negative for back pain. Integumentary: Negative for rash. Neurological: Negative for headaches, focal weakness or numbness.  ____________________________________________   PHYSICAL EXAM:  VITAL SIGNS: ED Triage Vitals  Enc Vitals Group     BP 12/17/17 2354 (!) 147/82     Pulse Rate 12/17/17 2354 (!) 128     Resp 12/17/17 2354 20     Temp 12/17/17 2354 98 F (36.7 C)     Temp Source 12/17/17 2354 Oral     SpO2 12/17/17 2354 98 %     Weight 12/17/17 2351 102.1 kg (225 lb)     Height 12/17/17 2351 1.626 m (5\' 4" )     Head Circumference --      Peak Flow --      Pain Score 12/17/17 2351 10     Pain Loc --      Pain Edu? --      Excl. in GC? --     Constitutional: Alert and oriented. Well appearing and in no acute distress. Eyes: Conjunctivae are normal.  Mouth/Throat: Mucous membranes are moist.  Oropharynx non-erythematous.  Multiple dental caries with left maxillary central incisor peridental gingival swelling. Neck: No stridor.   Cardiovascular: Normal rate, regular rhythm. Good peripheral circulation. Grossly normal heart sounds. Respiratory: Normal respiratory effort.  No retractions. Lungs CTAB. Gastrointestinal: Soft and nontender. No distention.   Musculoskeletal: No lower extremity tenderness nor edema. No gross deformities of extremities. Neurologic:  No gross focal neurologic deficits are appreciated.  Skin:  Skin is warm, dry and intact. No rash noted.     Procedures   ____________________________________________   INITIAL IMPRESSION / ASSESSMENT AND PLAN / ED COURSE  As part of my  medical decision making, I reviewed the following data within the electronic MEDICAL RECORD NUMBER  32 year old male presenting with above-stated history and physical exam secondary to dental caries or potential abscess.  Patient given viscous lidocaine swish and spit IM Toradol 60 mg and Augmentin in the emergency department will be prescribed Toradol and Augmentin for home.  [facility]. ____________________________________________  FINAL CLINICAL IMPRESSION(S) / ED DIAGNOSES  Final diagnoses:  Dental caries  Dental abscess     MEDICATIONS GIVEN DURING THIS VISIT:  Medications  lidocaine (XYLOCAINE) 2 % viscous mouth solution 15 mL (15 mLs Mouth/Throat Given 12/18/17 0134)  ketorolac (TORADOL) injection 60 mg (60 mg Intramuscular Given 12/18/17 0135)  amoxicillin-clavulanate (AUGMENTIN) 875-125 MG per tablet 1 tablet (1 tablet Oral Given 12/18/17 0133)     ED Discharge Orders         Ordered    amoxicillin-clavulanate (AUGMENTIN) 875-125 MG tablet  2 times daily     12/18/17 0120    ketorolac (TORADOL) 10 MG tablet  Every 6 hours PRN     12/18/17 0120           Note:  This document was prepared using Dragon voice recognition software and may include unintentional dictation errors.    Darci CurrentBrown, [place] N, MD 12/18/17 [id number]

## 2018-01-23 ENCOUNTER — Encounter: Payer: Self-pay | Admitting: Emergency Medicine

## 2018-01-23 ENCOUNTER — Emergency Department
Admission: EM | Admit: 2018-01-23 | Discharge: 2018-01-23 | Disposition: A | Discharge status: Home / Self Care | Payer: Self-pay | Attending: Emergency Medicine | Admitting: Emergency Medicine

## 2018-01-23 ENCOUNTER — Other Ambulatory Visit: Payer: Self-pay

## 2018-01-23 DIAGNOSIS — K029 Dental caries, unspecified: Secondary | ICD-10-CM | POA: Insufficient documentation

## 2018-01-23 DIAGNOSIS — Z79899 Other long term (current) drug therapy: Secondary | ICD-10-CM | POA: Insufficient documentation

## 2018-01-23 DIAGNOSIS — K047 Periapical abscess without sinus: Secondary | ICD-10-CM | POA: Insufficient documentation

## 2018-01-23 DIAGNOSIS — F17210 Nicotine dependence, cigarettes, uncomplicated: Secondary | ICD-10-CM | POA: Insufficient documentation

## 2018-01-23 MED ORDER — LIDOCAINE VISCOUS HCL 2 % MT SOLN
15.0000 mL | Freq: Once | OROMUCOSAL | Status: AC
  Administered 2018-01-23: 15 mL via OROMUCOSAL
  Filled 2018-01-23: qty 15

## 2018-01-23 MED ORDER — IBUPROFEN 800 MG PO TABS
800.0000 mg | ORAL_TABLET | Freq: Once | ORAL | Status: AC
  Administered 2018-01-23: 800 mg via ORAL
  Filled 2018-01-23: qty 1

## 2018-01-23 MED ORDER — IBUPROFEN 600 MG PO TABS: 600.0000 mg | ORAL_TABLET | Freq: Three times a day (TID) | ORAL | 0 refills | Status: DC | PRN

## 2018-01-23 MED ORDER — AMOXICILLIN 500 MG PO CAPS: 500.0000 mg | ORAL_CAPSULE | Freq: Three times a day (TID) | ORAL | 0 refills | Status: DC

## 2018-01-23 MED ORDER — TRAMADOL HCL 50 MG PO TABS: 50.0000 mg | ORAL_TABLET | Freq: Four times a day (QID) | ORAL | 0 refills | Status: DC | PRN

## 2018-01-23 NOTE — ED Provider Notes (Signed)
[facility] Emergency Department Provider Note   ____________________________________________   First MD Initiated Contact with Patient 01/23/18 1646     (approximate)  I have reviewed the triage vital signs and the nursing notes.   HISTORY  Chief Complaint Dental Pain    HPI Dillon White is a 33 y.o. male patient presents with dental pain secondary to multiple caries and suspected broken tooth.  Patient state pain increased 2 days ago.  Patient states this morning noticed swelling to the left side of his face.  Patient denies fever associated with this complaint.  Patient rates pain as a 10/10.  Patient scribed pain is "achy".  No palliative measure for complaint.   Past Medical History:  Diagnosis Date  . Bronchitis     Patient Active Problem List   Diagnosis Date Noted  . Confusion 07/01/2016  . Fever 07/01/2016  . Neck pain 07/01/2016  . Confusion and disorientation 07/01/2016    History reviewed. No pertinent surgical history.  Prior to Admission medications   Medication Sig Start Date End Date Taking? Authorizing Provider  albuterol (PROVENTIL HFA;VENTOLIN HFA) 108 (90 Base) MCG/ACT inhaler Inhale 2 puffs into the lungs every 4 (four) hours as needed for wheezing or shortness of breath. 09/24/15   Irean HongSung, Jade J, MD  amoxicillin (AMOXIL) 500 MG capsule Take 1 capsule (500 mg total) by mouth 3 (three) times daily. 01/23/18   Joni ReiningSmith, Ronald K, PA-C  doxycycline (VIBRA-TABS) 100 MG tablet Take 1 tablet (100 mg total) by mouth every 12 (twelve) hours. 07/03/16   Enedina FinnerPatel, Sona, MD  ibuprofen (ADVIL,MOTRIN) 600 MG tablet Take 1 tablet (600 mg total) by mouth every 8 (eight) hours as needed. 01/23/18   Joni ReiningSmith, Ronald K, PA-C  ketorolac (TORADOL) 10 MG tablet Take 1 tablet (10 mg total) by mouth every 6 (six) hours as needed. 12/18/17   Darci CurrentBrown, [place] N, MD  nicotine (NICODERM CQ - DOSED IN MG/24 HOURS) 21 mg/24hr patch Place 21 mg onto the skin daily as  needed.    [provider]  traMADol (ULTRAM) 50 MG tablet Take 1 tablet (50 mg total) by mouth every 6 (six) hours as needed. 01/23/18 01/23/19  Joni ReiningSmith, Ronald K, PA-C    Allergies Patient has no known allergies.  No family history on file.  Social History Social History   Tobacco Use  . Smoking status: Current Every Day Smoker    Packs/day: 0.50    Types: Cigarettes  . Smokeless tobacco: Never Used  Substance Use Topics  . Alcohol use: No  . Drug use: No    Review of Systems Constitutional: No fever/chills Eyes: No visual changes. ENT: No sore throat.  Dental pain. Cardiovascular: Denies chest pain. Respiratory: Denies shortness of breath. Gastrointestinal: No abdominal pain.  No nausea, no vomiting.  No diarrhea.  No constipation. Genitourinary: Negative for dysuria. Musculoskeletal: Negative for back pain. Skin: Negative for rash. Neurological: Negative for headaches, focal weakness or numbness.   ____________________________________________   PHYSICAL EXAM:  VITAL SIGNS: ED Triage Vitals  Enc Vitals Group     BP      Pulse      Resp      Temp      Temp src      SpO2      Weight      Height      Head Circumference      Peak Flow      Pain Score  Pain Loc      Pain Edu?      Excl. in GC?    Constitutional: Alert and oriented. Well appearing and in no acute distress. Eyes: Conjunctivae are normal. PERRL. EOMI. Head: Atraumatic. Nose: No congestion/rhinnorhea. Mouth/Throat: Mucous membranes are moist.  Oropharynx non-erythematous.  Multiple dental caries upper and lower oral cavity.  Edematous gingiva. Neck: No stridor.  Hematological/Lymphatic/Immunilogical: No cervical lymphadenopathy. Cardiovascular: Normal rate, regular rhythm. Grossly normal heart sounds.  Good peripheral circulation.  Elevated blood pressure. Respiratory: Normal respiratory effort.  No retractions. Lungs CTAB. Skin:  Skin is warm, dry and intact. No rash  noted. Psychiatric: Mood and affect are normal. Speech and behavior are normal.  ____________________________________________   LABS (all labs ordered are listed, but only abnormal results are displayed)  Labs Reviewed - No data to display ____________________________________________  EKG   ____________________________________________  RADIOLOGY  ED MD interpretation:    Official radiology report(s): No results found.  ____________________________________________   PROCEDURES  Procedure(s) performed: None  Procedures  Critical Care performed: No  ____________________________________________   INITIAL IMPRESSION / ASSESSMENT AND PLAN / ED COURSE  As part of my medical decision making, I reviewed the following data within the electronic MEDICAL RECORD NUMBER    Dental pain secondary to multiple caries and gingivitis.  Patient given discharge care instructions.  Patient advised to follow-up for list of dental clinics provided.  Patient vies take medication as directed.     ____________________________________________   FINAL CLINICAL IMPRESSION(S) / ED DIAGNOSES  Final diagnoses:  Infected dental caries     ED Discharge Orders         Ordered    amoxicillin (AMOXIL) 500 MG capsule  3 times daily     01/23/18 1742    traMADol (ULTRAM) 50 MG tablet  Every 6 hours PRN     01/23/18 1742    ibuprofen (ADVIL,MOTRIN) 600 MG tablet  Every 8 hours PRN     01/23/18 1742           Note:  This document was prepared using Dragon voice recognition software and may include unintentional dictation errors.    Joni Reining, PA-C 01/23/18 1748    Don Perking, [place], MD 01/26/18 1700

## 2018-01-23 NOTE — ED Triage Notes (Signed)
Presents with possible dental abscess   Unsure if he has a broken tooth  Pain started about 2 days ago   Some swelling noted to left side of face

## 2018-01-23 NOTE — Discharge Instructions (Addendum)
Follow-up with list of dental clinics provided.

## 2018-03-23 ENCOUNTER — Other Ambulatory Visit: Payer: Self-pay

## 2018-03-23 ENCOUNTER — Emergency Department
Admission: EM | Admit: 2018-03-23 | Discharge: 2018-03-23 | Disposition: A | Discharge status: Home / Self Care | Payer: Self-pay | Attending: Emergency Medicine | Admitting: Emergency Medicine

## 2018-03-23 DIAGNOSIS — K611 Rectal abscess: Secondary | ICD-10-CM | POA: Insufficient documentation

## 2018-03-23 DIAGNOSIS — F17210 Nicotine dependence, cigarettes, uncomplicated: Secondary | ICD-10-CM | POA: Insufficient documentation

## 2018-03-23 MED ORDER — SULFAMETHOXAZOLE-TRIMETHOPRIM 800-160 MG PO TABS: 1.0000 | ORAL_TABLET | Freq: Two times a day (BID) | ORAL | 0 refills | Status: DC

## 2018-03-23 MED ORDER — SULFAMETHOXAZOLE-TRIMETHOPRIM 800-160 MG PO TABS
2.0000 | ORAL_TABLET | Freq: Once | ORAL | Status: AC
  Administered 2018-03-23: 2 via ORAL
  Filled 2018-03-23: qty 2

## 2018-03-23 MED ORDER — SULFAMETHOXAZOLE-TRIMETHOPRIM 800-160 MG PO TABS: 1.0000 | ORAL_TABLET | Freq: Once | ORAL | Status: DC

## 2018-03-23 MED ORDER — HYDROCODONE-ACETAMINOPHEN 5-325 MG PO TABS: 1.0000 | ORAL_TABLET | Freq: Four times a day (QID) | ORAL | 0 refills | Status: AC | PRN

## 2018-03-23 NOTE — Discharge Instructions (Signed)
Take the prescription meds as directed. Apply warm compresses or use warm sitz baths to promote healing. Follow-up with this ED or Dr. Lady Gary for ongoing symptoms.

## 2018-03-23 NOTE — ED Triage Notes (Signed)
Pt has possible abscess, he calls it boil, next to rectum. Denies bleeding. Noticed swelling Friday. States uncomfortable to sit. Denies fever.

## 2018-03-24 NOTE — ED Provider Notes (Signed)
[facility] Emergency Department Provider Note ____________________________________________  Time seen: 1948  I have reviewed the triage vital signs and the nursing notes.  HISTORY  Chief Complaint  Abscess  HPI Dillon White is a 33 y.o. male presents to the ED for evaluation of rectal pain.  Patient describes pressure and fullness to the region between the rectum and the buttocks.  He denies any bleeding, spontaneous drainage, pain with defecation, or constipation.  He noticed a swelling about 3 to 4 days prior.  Since that time he has had increased discomfort with attempts to sit.  He denies any fevers, chills, or sweats.  He denies any previous history of pilonidal cyst, rectal abscesses, or hemorrhoids.  Past Medical History:  Diagnosis Date  . Bronchitis     Patient Active Problem List   Diagnosis Date Noted  . Confusion 07/01/2016  . Fever 07/01/2016  . Neck pain 07/01/2016  . Confusion and disorientation 07/01/2016    History reviewed. No pertinent surgical history.  Prior to Admission medications   Medication Sig Start Date End Date Taking? Authorizing Provider  HYDROcodone-acetaminophen (NORCO) 5-325 MG tablet Take 1 tablet by mouth every 6 (six) hours as needed for up to 3 days. 03/23/18 03/26/18  Hanh Kertesz, Charlesetta Ivory, PA-C  sulfamethoxazole-trimethoprim (BACTRIM DS,SEPTRA DS) 800-160 MG tablet Take 1 tablet by mouth 2 (two) times daily. 03/23/18   Tomasina Keasling, Charlesetta Ivory, PA-C    Allergies Patient has no known allergies.  History reviewed. No pertinent family history.  Social History Social History   Tobacco Use  . Smoking status: Current Every Day Smoker    Packs/day: 0.50    Types: Cigarettes  . Smokeless tobacco: Never Used  Substance Use Topics  . Alcohol use: No  . Drug use: No    Review of Systems  Constitutional: Negative for fever. Eyes: Negative for visual changes. ENT: Negative for sore throat. Cardiovascular:  Negative for chest pain. Respiratory: Negative for shortness of breath. Gastrointestinal: Negative for abdominal pain, vomiting and diarrhea.  Rectal pain as above. Genitourinary: Negative for dysuria. Musculoskeletal: Negative for back pain. Skin: Negative for rash. Neurological: Negative for headaches, focal weakness or numbness. ____________________________________________  PHYSICAL EXAM:  VITAL SIGNS: ED Triage Vitals  Enc Vitals Group     BP 03/23/18 1733 129/89     Pulse Rate 03/23/18 1733 (!) 106     Resp 03/23/18 1733 18     Temp 03/23/18 1733 98.6 F (37 C)     Temp Source 03/23/18 1733 Oral     SpO2 03/23/18 1733 96 %     Weight 03/23/18 1734 225 lb (102.1 kg)     Height 03/23/18 1734 5\' 5"  (1.651 m)     Head Circumference --      Peak Flow --      Pain Score 03/23/18 1734 7     Pain Loc --      Pain Edu? --      Excl. in GC? --     Constitutional: Alert and oriented. Well appearing and in no distress. Head: Normocephalic and atraumatic. Eyes: Conjunctivae are normal.  Normal extraocular movements Cardiovascular: Normal rate, regular rhythm. Normal distal pulses. Respiratory: Normal respiratory effort.  Gastrointestinal: Soft and nontender. No distention.  Rectal exam reveals a subcutaneous area of early induration without pointing, fluctuance, sinus tracking, punctum or spontaneous drainage.  The area is to the perirectal region of the left buttocks.  No external hemorrhoids are appreciated. Musculoskeletal: Nontender with  normal range of motion in all extremities.  Neurologic:  Normal gait without ataxia. Normal speech and language. No gross focal neurologic deficits are appreciated. Skin:  Skin is warm, dry and intact. No rash noted. ____________________________________________  PROCEDURES  Procedures Bactrim DS ii PO ____________________________________________  INITIAL IMPRESSION / ASSESSMENT AND PLAN / ED COURSE  Patient with ED evaluation of a  perirectal abscess.  Patient presentation today is not amenable to incision and drainage.  We discussed the possibility of surgical intervention and/or referral.  Patient was inclined to start on the Bactrim , and manage the area as discussed.  He should return to the ED immediately for any spontaneous drainage, or development of a pointing abscess.  Return precautions are confirmed as understood. ____________________________________________  FINAL CLINICAL IMPRESSION(S) / ED DIAGNOSES  Final diagnoses:  Perirectal abscess      Lissa Hoard, PA-C 03/24/18 0046    Phineas Semen, MD 03/24/18 [phone]

## 2018-04-02 ENCOUNTER — Encounter: Payer: Self-pay | Admitting: *Deleted

## 2018-09-21 ENCOUNTER — Encounter: Payer: Self-pay | Admitting: Emergency Medicine

## 2018-09-21 ENCOUNTER — Other Ambulatory Visit: Payer: Self-pay

## 2018-09-21 ENCOUNTER — Emergency Department
Admission: EM | Admit: 2018-09-21 | Discharge: 2018-09-21 | Disposition: A | Discharge status: Home / Self Care | Payer: Self-pay | Attending: Emergency Medicine | Admitting: Emergency Medicine

## 2018-09-21 DIAGNOSIS — S61211A Laceration without foreign body of left index finger without damage to nail, initial encounter: Secondary | ICD-10-CM | POA: Insufficient documentation

## 2018-09-21 DIAGNOSIS — W268XXA Contact with other sharp object(s), not elsewhere classified, initial encounter: Secondary | ICD-10-CM | POA: Insufficient documentation

## 2018-09-21 DIAGNOSIS — Y92512 Supermarket, store or market as the place of occurrence of the external cause: Secondary | ICD-10-CM | POA: Insufficient documentation

## 2018-09-21 DIAGNOSIS — Z79899 Other long term (current) drug therapy: Secondary | ICD-10-CM | POA: Insufficient documentation

## 2018-09-21 DIAGNOSIS — Y999 Unspecified external cause status: Secondary | ICD-10-CM | POA: Insufficient documentation

## 2018-09-21 DIAGNOSIS — Y9389 Activity, other specified: Secondary | ICD-10-CM | POA: Insufficient documentation

## 2018-09-21 DIAGNOSIS — Z23 Encounter for immunization: Secondary | ICD-10-CM | POA: Insufficient documentation

## 2018-09-21 DIAGNOSIS — F17210 Nicotine dependence, cigarettes, uncomplicated: Secondary | ICD-10-CM | POA: Insufficient documentation

## 2018-09-21 MED ORDER — TETANUS-DIPHTH-ACELL PERTUSSIS 5-2.5-18.5 LF-MCG/0.5 IM SUSP
0.5000 mL | Freq: Once | INTRAMUSCULAR | Status: AC
  Administered 2018-09-21: 0.5 mL via INTRAMUSCULAR
  Filled 2018-09-21: qty 0.5
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
## 2018-09-21 NOTE — ED Notes (Signed)
No peripheral IV placed this visit.   Discharge instructions reviewed with patient. Questions fielded by this RN. Patient verbalizes understanding of instructions. Patient discharged home in stable condition per [place], [place]. No acute distress noted at time of discharge.
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
## 2020-09-14 ENCOUNTER — Encounter: Payer: Self-pay | Admitting: Emergency Medicine

## 2020-09-14 ENCOUNTER — Other Ambulatory Visit: Payer: Self-pay

## 2020-09-14 ENCOUNTER — Emergency Department: Payer: No Typology Code available for payment source

## 2020-09-14 ENCOUNTER — Emergency Department
Admission: EM | Admit: 2020-09-14 | Discharge: 2020-09-15 | Disposition: A | Discharge status: Home / Self Care | Payer: No Typology Code available for payment source | Attending: Emergency Medicine | Admitting: Emergency Medicine

## 2020-09-14 DIAGNOSIS — F17210 Nicotine dependence, cigarettes, uncomplicated: Secondary | ICD-10-CM | POA: Insufficient documentation

## 2020-09-14 DIAGNOSIS — M79602 Pain in left arm: Secondary | ICD-10-CM | POA: Diagnosis present

## 2020-09-14 DIAGNOSIS — L03114 Cellulitis of left upper limb: Secondary | ICD-10-CM | POA: Insufficient documentation

## 2020-09-14 LAB — CBC WITH DIFFERENTIAL/PLATELET
Abs Immature Granulocytes: 0.09 10*3/uL — ABNORMAL HIGH (ref 0.00–0.07)
Basophils Absolute: 0.1 10*3/uL (ref 0.0–0.1)
Basophils Relative: 1 %
Eosinophils Absolute: 0.2 10*3/uL (ref 0.0–0.5)
Eosinophils Relative: 2 %
HCT: 40.1 % (ref 39.0–52.0)
Hemoglobin: 14.2 g/dL (ref 13.0–17.0)
Immature Granulocytes: 1 %
Lymphocytes Relative: 23 %
Lymphs Abs: 2 10*3/uL (ref 0.7–4.0)
MCH: 32.6 pg (ref 26.0–34.0)
MCHC: 35.4 g/dL (ref 30.0–36.0)
MCV: 92 fL (ref 80.0–100.0)
Monocytes Absolute: 1.1 10*3/uL — ABNORMAL HIGH (ref 0.1–1.0)
Monocytes Relative: 13 %
Neutro Abs: 5.1 10*3/uL (ref 1.7–7.7)
Neutrophils Relative %: 60 %
Platelets: 177 10*3/uL (ref 150–400)
RBC: 4.36 MIL/uL (ref 4.22–5.81)
RDW: 12.7 % (ref 11.5–15.5)
WBC: 8.4 10*3/uL (ref 4.0–10.5)
nRBC: 0 % (ref 0.0–0.2)

## 2020-09-14 LAB — BASIC METABOLIC PANEL
Anion gap: 8 (ref 5–15)
BUN: 12 mg/dL (ref 6–20)
CO2: 26 mmol/L (ref 22–32)
Calcium: 8.7 mg/dL — ABNORMAL LOW (ref 8.9–10.3)
Chloride: 103 mmol/L (ref 98–111)
Creatinine, Ser: 0.9 mg/dL (ref 0.61–1.24)
GFR, Estimated: >60 mL/min (ref >60)
Glucose, Bld: 110 mg/dL — ABNORMAL HIGH (ref 70–99)
Potassium: 3.8 mmol/L (ref 3.5–5.1)
Sodium: 137 mmol/L (ref 135–145)

## 2020-09-14 MED ORDER — CEFAZOLIN SODIUM-DEXTROSE 2-4 GM/100ML-% IV SOLN
2.0000 g | Freq: Once | INTRAVENOUS | Status: AC
  Administered 2020-09-14: 2 g via INTRAVENOUS
  Filled 2020-09-14: qty 100

## 2020-09-14 MED ORDER — CEPHALEXIN 500 MG PO CAPS: 500.0000 mg | ORAL_CAPSULE | Freq: Three times a day (TID) | ORAL | 0 refills | Status: AC

## 2020-09-14 MED ORDER — SODIUM CHLORIDE 0.9 % IV BOLUS
1000.0000 mL | Freq: Once | INTRAVENOUS | Status: AC
  Administered 2020-09-14: 1000 mL via INTRAVENOUS

## 2020-09-14 MED ORDER — SULFAMETHOXAZOLE-TRIMETHOPRIM 800-160 MG PO TABS
1.0000 | ORAL_TABLET | Freq: Once | ORAL | Status: AC
  Administered 2020-09-15: 1 via ORAL
  Filled 2020-09-14: qty 1

## 2020-09-14 MED ORDER — SULFAMETHOXAZOLE-TRIMETHOPRIM 800-160 MG PO TABS: 1.0000 | ORAL_TABLET | Freq: Two times a day (BID) | ORAL | 0 refills | Status: AC

## 2020-09-14 NOTE — Discharge Instructions (Addendum)
Please take the antibiotics as prescribed and until finished.  Follow-up with primary care in 2 to 3 days for wound check.  If your symptoms, change or worsen and you are unable to schedule an appointment, please return to the emergency department.
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
## 2020-09-14 NOTE — ED Notes (Signed)
EDP, McHugh to triage to assess.  See new orders.
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
## 2021-01-02 ENCOUNTER — Emergency Department: Payer: Self-pay

## 2021-01-02 ENCOUNTER — Emergency Department
Admission: EM | Admit: 2021-01-02 | Discharge: 2021-01-02 | Disposition: A | Discharge status: Home / Self Care | Payer: Self-pay | Attending: Emergency Medicine | Admitting: Emergency Medicine

## 2021-01-02 ENCOUNTER — Other Ambulatory Visit: Payer: Self-pay

## 2021-01-02 DIAGNOSIS — R369 Urethral discharge, unspecified: Secondary | ICD-10-CM | POA: Insufficient documentation

## 2021-01-02 DIAGNOSIS — Z20822 Contact with and (suspected) exposure to covid-19: Secondary | ICD-10-CM | POA: Insufficient documentation

## 2021-01-02 DIAGNOSIS — J069 Acute upper respiratory infection, unspecified: Secondary | ICD-10-CM | POA: Insufficient documentation

## 2021-01-02 DIAGNOSIS — F17210 Nicotine dependence, cigarettes, uncomplicated: Secondary | ICD-10-CM | POA: Insufficient documentation

## 2021-01-02 LAB — CHLAMYDIA/NGC RT PCR (ARMC ONLY)
Chlamydia Tr: NOT DETECTED
N gonorrhoeae: NOT DETECTED

## 2021-01-02 LAB — GROUP A STREP BY PCR: Group A Strep by PCR: NOT DETECTED

## 2021-01-02 LAB — URINALYSIS, COMPLETE (UACMP) WITH MICROSCOPIC
Bacteria, UA: NONE SEEN
Bilirubin Urine: NEGATIVE
Glucose, UA: NEGATIVE mg/dL
Hgb urine dipstick: NEGATIVE
Ketones, ur: NEGATIVE mg/dL
Nitrite: NEGATIVE
Protein, ur: 30 mg/dL — AB
Specific Gravity, Urine: 1.027 (ref 1.005–1.030)
WBC, UA: >50 WBC/hpf — ABNORMAL HIGH (ref 0–5)
pH: 5 (ref 5.0–8.0)

## 2021-01-02 LAB — RESP PANEL BY RT-PCR (FLU A&B, COVID) ARPGX2
Influenza A by PCR: NEGATIVE
Influenza B by PCR: NEGATIVE
SARS Coronavirus 2 by RT PCR: NEGATIVE

## 2021-01-02 MED ORDER — LIDOCAINE HCL (PF) 1 % IJ SOLN
INTRAMUSCULAR | Status: AC
  Filled 2021-01-02: qty 5

## 2021-01-02 MED ORDER — CEFTRIAXONE SODIUM 1 G IJ SOLR
500.0000 mg | Freq: Once | INTRAMUSCULAR | Status: AC
  Administered 2021-01-02: 16:00:00 500 mg via INTRAMUSCULAR
  Filled 2021-01-02: qty 10

## 2021-01-02 MED ORDER — AZITHROMYCIN 500 MG PO TABS
1000.0000 mg | ORAL_TABLET | Freq: Once | ORAL | Status: AC
  Administered 2021-01-02: 16:00:00 1000 mg via ORAL
  Filled 2021-01-02: qty 2

## 2021-01-02 MED ORDER — PSEUDOEPH-BROMPHEN-DM 30-2-10 MG/5ML PO SYRP: 5.0000 mL | ORAL_SOLUTION | Freq: Four times a day (QID) | ORAL | 0 refills | Status: AC | PRN

## 2021-01-02 NOTE — ED Triage Notes (Signed)
Pt in with co congestion and cough x 6 days., Recently exposed to strep and flu states he did negative for covid. Pt also co greenish penile discharge that started yesterday.

## 2021-01-02 NOTE — ED Provider Notes (Signed)
[facility] Emergency Department Provider Note  ____________________________________________   Event Date/Time   First MD Initiated Contact with Patient 01/02/21 1329     (approximate)  I have reviewed the triage vital signs and the nursing notes.   HISTORY  Chief Complaint Nasal Congestion and Penile Discharge   HPI Dillon White is a 35 y.o. male presents to the ED with complaint of cough and congestion for the last 6 days.  Patient states that he was exposed to strep and flu.  He states that he did a test and was negative for COVID.  He is a 1-2 pack-a-day smoker for many years.  He denies any previous history of asthma or bronchitis.  Patient also complains of a greenish penile discharge that started yesterday.  He denies any possibility of STD.  He rates pain as 6 out of 10.       Past Medical History:  Diagnosis Date   Bronchitis     Patient Active Problem List   Diagnosis Date Noted   Confusion 07/01/2016   Fever 07/01/2016   Neck pain 07/01/2016   Confusion and disorientation 07/01/2016    No past surgical history on file.  Prior to Admission medications   Medication Sig Start Date End Date Taking? Authorizing Provider  brompheniramine-pseudoephedrine-DM 30-2-10 MG/5ML syrup Take 5 mLs by mouth 4 (four) times daily as needed. 01/02/21  Yes Bridget Hartshorn L, PA-C  sulfamethoxazole-trimethoprim (BACTRIM DS) 800-160 MG tablet Take 1 tablet by mouth 2 (two) times daily. 09/14/20   Chinita Pester, FNP    Allergies Patient has no known allergies.  No family history on file.  Social History Social History   Tobacco Use   Smoking status: Every Day    Packs/day: 0.50    Types: Cigarettes   Smokeless tobacco: Never  Vaping Use   Vaping Use: Never used  Substance Use Topics   Alcohol use: No   Drug use: No    Review of Systems Constitutional: No fever/chills Eyes: No visual changes. ENT: No sore throat.  Positive nasal  congestion. Cardiovascular: Denies chest pain. Respiratory: Denies shortness of breath.  Positive for cough. Gastrointestinal: No abdominal pain.  No nausea, no vomiting.  No diarrhea.   Genitourinary: Positive for penile discharge. Musculoskeletal: Positive for body aches. Skin: Negative for rash. Neurological: Negative for headaches, focal weakness or numbness. ____________________________________________   PHYSICAL EXAM:  VITAL SIGNS: ED Triage Vitals  Enc Vitals Group     BP 01/02/21 1323 (!) 124/108     Pulse Rate 01/02/21 1323 (!) 119     Resp 01/02/21 1323 20     Temp 01/02/21 1323 98.4 F (36.9 C)     Temp Source 01/02/21 1323 Oral     SpO2 01/02/21 1323 98 %     Weight 01/02/21 1324 225 lb (102.1 kg)     Height 01/02/21 1324 5\' 5"  (1.651 m)     Head Circumference --      Peak Flow --      Pain Score 01/02/21 1323 6     Pain Loc --      Pain Edu? --      Excl. in GC? --     Constitutional: Alert and oriented. Well appearing and in no acute distress. Eyes: Conjunctivae are normal.  Head: Atraumatic. Nose: Moderate congestion/rhinnorhea. Neck: No stridor.   Cardiovascular: Normal rate, regular rhythm. Grossly normal heart sounds.  Good peripheral circulation. Respiratory: Normal respiratory effort.  No retractions.  Lungs coarse cough is noted during exam.  There is a faint expiratory wheeze heard on the right lower base that was not heard consistently. Gastrointestinal: Soft and nontender. No distention.  Musculoskeletal: Moves upper and lower extremities that any difficulty normal gait was noted. Neurologic:  Normal speech and language. No gross focal neurologic deficits are appreciated.  Skin:  Skin is warm, dry and intact. No rash noted. Psychiatric: Mood and affect are normal. Speech and behavior are normal.  ____________________________________________   LABS (all labs ordered are listed, but only abnormal results are displayed)  Labs Reviewed   URINALYSIS, COMPLETE (UACMP) WITH MICROSCOPIC - Abnormal; Notable for the following components:      Result Value   Color, Urine YELLOW (*)    APPearance HAZY (*)    Protein, ur 30 (*)    Leukocytes,Ua MODERATE (*)    WBC, UA >50 (*)    All other components within normal limits  RESP PANEL BY RT-PCR (FLU A&B, COVID) ARPGX2  GROUP A STREP BY PCR  CHLAMYDIA/NGC RT PCR ([facility] ONLY)            URINE CULTURE   ____________________________________________   RADIOLOGY Beaulah Corin, personally viewed and evaluated these images (plain radiographs) as part of my medical decision making, as well as reviewing the written report by the radiologist.   Official radiology report(s): DG Chest Port 1 View  Result Date: 01/02/2021 CLINICAL DATA:  Cough for 1 week, history of tobacco abuse, initial encounter EXAM: PORTABLE CHEST 1 VIEW COMPARISON:  07/01/2016 FINDINGS: The heart size and mediastinal contours are within normal limits. Both lungs are clear. The visualized skeletal structures are unremarkable. IMPRESSION: No active disease. Electronically Signed   By: Alcide Clever M.D.   On: 01/02/2021 15:30    ____________________________________________   PROCEDURES  Procedure(s) performed (including Critical Care):  Procedures   ____________________________________________   INITIAL IMPRESSION / ASSESSMENT AND PLAN / ED COURSE  As part of my medical decision making, I reviewed the following data within the electronic MEDICAL RECORD NUMBER Notes from prior ED visits and [place] Controlled Substance Database  35 year old male presents to the ED with complaint of respiratory symptoms and cough for the last 6 days.  He also complains of a green penile discharge but is unaware of any known STD exposure.  Chest x-ray was negative, influenza and COVID were negative.  Urinalysis showed moderate leukocytes with greater than 50,000 WBCs.  This was discussed with patient and a urine culture along with GC  and Chlamydia test were ordered.  Patient was made aware that he can see the results of these test on MyChart but that we would go ahead and empirically treat him for both.  He received Rocephin 500 mg IM and 1 g of Zithromax.  ____________________________________________   FINAL CLINICAL IMPRESSION(S) / ED DIAGNOSES  Final diagnoses:  Viral URI with cough  Penile discharge     ED Discharge Orders          Ordered    brompheniramine-pseudoephedrine-DM 30-2-10 MG/5ML syrup  4 times daily PRN        01/02/21 1552             Note:  This document was prepared using Dragon voice recognition software and may include unintentional dictation errors.    Tommi Rumps, PA-C 01/02/21 1607    Chesley Noon, MD 01/03/21 [phone]

## 2021-01-02 NOTE — ED Notes (Signed)
Pt discharged home after verbalizing understanding of discharge instructions; nad noted. 

## 2021-01-02 NOTE — ED Provider Notes (Signed)
Emergency Medicine Provider Triage Evaluation Note  Dillon White , a 35 y.o. male  was evaluated in triage.  Pt complains of URI symptoms and green penile discharge.  Review of Systems  Positive: URI, cough, congestion, sore throat, green discharge from his penis Negative: Chest pain, shortness of breath, dysuria  Physical Exam  There were no vitals taken for this visit. Gen:   Awake, no distress   Resp:  Normal effort  MSK:   Moves extremities without difficulty  Other:    Medical Decision Making  Medically screening exam initiated at 1:23 PM.  Appropriate orders placed.  Dillon White was informed that the remainder of the evaluation will be completed by another provider, this initial triage assessment does not replace that evaluation, and the importance of remaining in the ED until their evaluation is complete.  Respiratory panel, strep test, GC/chlamydia and UA were   Faythe Ghee, PA-C 01/02/21 1324    Georga Hacking, MD 01/02/21 1438

## 2021-01-02 NOTE — Discharge Instructions (Signed)
Follow-up with your primary care provider if any continued problems.  Also your blood pressure needs to be rechecked which was elevated in the ED today.  Follow the instructions at home setting up an account for MyChart so that she can see the results of your test.  You were given an antibiotic injection as well as pills to take.  If your test results are positive for gonorrhea or chlamydia you have already been treated for this.  This also is an antibiotic that would cover bronchitis.  Is prescription for Bromfed-DM was sent to your pharmacy to take as needed for cough.  Increase fluids.  Tylenol or ibuprofen as needed.

## 2021-01-03 LAB — URINE CULTURE: Culture: NO GROWTH
